# Patient Record
Sex: Female | Born: 1990 | Race: Black or African American | Hispanic: No | Marital: Single | Smoking: Never smoker
Health system: Southern US, Community
[De-identification: ages and names within clinical notes are randomized; demographics above are authoritative.]

## PROBLEM LIST (undated history)

## (undated) DIAGNOSIS — F259 Schizoaffective disorder, unspecified: Secondary | ICD-10-CM

## (undated) DIAGNOSIS — D571 Sickle-cell disease without crisis: Secondary | ICD-10-CM

## (undated) DIAGNOSIS — R479 Unspecified speech disturbances: Secondary | ICD-10-CM

## (undated) DIAGNOSIS — F809 Developmental disorder of speech and language, unspecified: Secondary | ICD-10-CM

## (undated) DIAGNOSIS — J45909 Unspecified asthma, uncomplicated: Secondary | ICD-10-CM

## (undated) DIAGNOSIS — F79 Unspecified intellectual disabilities: Secondary | ICD-10-CM

## (undated) DIAGNOSIS — F419 Anxiety disorder, unspecified: Secondary | ICD-10-CM

---

## 2014-11-20 ENCOUNTER — Emergency Department: Payer: Self-pay | Admitting: Emergency Medicine

## 2014-11-20 LAB — URINALYSIS, COMPLETE
BACTERIA: NONE SEEN
BILIRUBIN, UR: NEGATIVE
Blood: NEGATIVE
Glucose,UR: NEGATIVE mg/dL (ref 0–75)
KETONE: NEGATIVE
Leukocyte Esterase: NEGATIVE
Nitrite: NEGATIVE
Ph: 7 (ref 4.5–8.0)
Protein: NEGATIVE
RBC,UR: <1 /HPF (ref 0–5)
Specific Gravity: 1.001 (ref 1.003–1.030)
Squamous Epithelial: <1
WBC UR: NONE SEEN /HPF (ref 0–5)

## 2014-11-20 LAB — CBC
HCT: 34.1 % — ABNORMAL LOW (ref 35.0–47.0)
HGB: 10.5 g/dL — AB (ref 12.0–16.0)
MCH: 23.8 pg — ABNORMAL LOW (ref 26.0–34.0)
MCHC: 30.9 g/dL — AB (ref 32.0–36.0)
MCV: 77 fL — ABNORMAL LOW (ref 80–100)
Platelet: 132 10*3/uL — ABNORMAL LOW (ref 150–440)
RBC: 4.43 10*6/uL (ref 3.80–5.20)
RDW: 18.8 % — ABNORMAL HIGH (ref 11.5–14.5)
WBC: 7.8 10*3/uL (ref 3.6–11.0)

## 2014-11-20 LAB — DRUG SCREEN, URINE

## 2014-11-20 LAB — PREGNANCY, URINE: PREGNANCY TEST, URINE: NEGATIVE m[IU]/mL

## 2014-11-20 LAB — COMPREHENSIVE METABOLIC PANEL
ANION GAP: 6 — AB (ref 7–16)
Albumin: 3.9 g/dL (ref 3.4–5.0)
Alkaline Phosphatase: 71 U/L (ref 46–116)
BUN: 4 mg/dL — ABNORMAL LOW (ref 7–18)
Bilirubin,Total: 0.5 mg/dL (ref 0.2–1.0)
CHLORIDE: 111 mmol/L — AB (ref 98–107)
CREATININE: 0.75 mg/dL (ref 0.60–1.30)
Calcium, Total: 9.4 mg/dL (ref 8.5–10.1)
Co2: 24 mmol/L (ref 21–32)
EGFR (Non-African Amer.): >60
Glucose: 90 mg/dL (ref 65–99)
Osmolality: 278 (ref 275–301)
Potassium: 3.8 mmol/L (ref 3.5–5.1)
SGOT(AST): 32 U/L (ref 15–37)
SGPT (ALT): 39 U/L (ref 14–63)
Sodium: 141 mmol/L (ref 136–145)
TOTAL PROTEIN: 8 g/dL (ref 6.4–8.2)

## 2014-11-20 LAB — ETHANOL: Ethanol: <3 mg/dL

## 2014-11-20 LAB — VALPROIC ACID LEVEL: Valproic Acid: 69 ug/mL

## 2014-11-20 LAB — LITHIUM LEVEL: Lithium: 0.7 mmol/L

## 2014-11-20 LAB — ACETAMINOPHEN LEVEL: Acetaminophen: <2 ug/mL

## 2014-11-20 LAB — SALICYLATE LEVEL: Salicylates, Serum: <1.7 mg/dL

## 2014-12-15 ENCOUNTER — Emergency Department: Payer: Self-pay | Admitting: Emergency Medicine

## 2015-01-26 ENCOUNTER — Encounter (HOSPITAL_COMMUNITY): Payer: Self-pay | Admitting: *Deleted

## 2015-01-26 ENCOUNTER — Emergency Department (HOSPITAL_COMMUNITY)
Admission: EM | Admit: 2015-01-26 | Discharge: 2015-01-26 | Disposition: A | Discharge status: Home / Self Care | Payer: Medicaid Other | Attending: Emergency Medicine | Admitting: Emergency Medicine

## 2015-01-26 DIAGNOSIS — F319 Bipolar disorder, unspecified: Secondary | ICD-10-CM | POA: Diagnosis present

## 2015-01-26 DIAGNOSIS — Z862 Personal history of diseases of the blood and blood-forming organs and certain disorders involving the immune mechanism: Secondary | ICD-10-CM | POA: Diagnosis not present

## 2015-01-26 DIAGNOSIS — Z79899 Other long term (current) drug therapy: Secondary | ICD-10-CM | POA: Insufficient documentation

## 2015-01-26 DIAGNOSIS — J45909 Unspecified asthma, uncomplicated: Secondary | ICD-10-CM | POA: Insufficient documentation

## 2015-01-26 DIAGNOSIS — F419 Anxiety disorder, unspecified: Secondary | ICD-10-CM | POA: Diagnosis not present

## 2015-01-26 DIAGNOSIS — E669 Obesity, unspecified: Secondary | ICD-10-CM | POA: Diagnosis not present

## 2015-01-26 DIAGNOSIS — F259 Schizoaffective disorder, unspecified: Secondary | ICD-10-CM | POA: Diagnosis not present

## 2015-01-26 DIAGNOSIS — Z760 Encounter for issue of repeat prescription: Secondary | ICD-10-CM | POA: Diagnosis not present

## 2015-01-26 DIAGNOSIS — Z791 Long term (current) use of non-steroidal anti-inflammatories (NSAID): Secondary | ICD-10-CM | POA: Insufficient documentation

## 2015-01-26 HISTORY — DX: Sickle-cell disease without crisis: D57.1

## 2015-01-26 HISTORY — DX: Developmental disorder of speech and language, unspecified: F80.9

## 2015-01-26 HISTORY — DX: Unspecified intellectual disabilities: F79

## 2015-01-26 HISTORY — DX: Unspecified asthma, uncomplicated: J45.909

## 2015-01-26 HISTORY — DX: Unspecified speech disturbances: R47.9

## 2015-01-26 HISTORY — DX: Anxiety disorder, unspecified: F41.9

## 2015-01-26 HISTORY — DX: Schizoaffective disorder, unspecified: F25.9

## 2015-01-26 LAB — COMPREHENSIVE METABOLIC PANEL
ALBUMIN: 3.8 g/dL (ref 3.5–5.2)
ALK PHOS: 71 U/L (ref 39–117)
ALT: 30 U/L (ref 0–35)
AST: 28 U/L (ref 0–37)
Anion gap: 12 (ref 5–15)
BILIRUBIN TOTAL: 0.4 mg/dL (ref 0.3–1.2)
BUN: 7 mg/dL (ref 6–23)
CHLORIDE: 106 mmol/L (ref 96–112)
CO2: 24 mmol/L (ref 19–32)
CREATININE: 0.69 mg/dL (ref 0.50–1.10)
Calcium: 8.8 mg/dL (ref 8.4–10.5)
GFR calc Af Amer: >90 mL/min (ref >90)
Glucose, Bld: 85 mg/dL (ref 70–99)
Potassium: 3.8 mmol/L (ref 3.5–5.1)
SODIUM: 142 mmol/L (ref 135–145)
Total Protein: 7.6 g/dL (ref 6.0–8.3)

## 2015-01-26 LAB — CBC
HCT: 32.4 % — ABNORMAL LOW (ref 36.0–46.0)
HEMOGLOBIN: 10.8 g/dL — AB (ref 12.0–15.0)
MCH: 24.7 pg — AB (ref 26.0–34.0)
MCHC: 33.3 g/dL (ref 30.0–36.0)
MCV: 74 fL — AB (ref 78.0–100.0)
PLATELETS: 182 10*3/uL (ref 150–400)
RBC: 4.38 MIL/uL (ref 3.87–5.11)
RDW: 16.6 % — ABNORMAL HIGH (ref 11.5–15.5)
WBC: 10.6 10*3/uL — ABNORMAL HIGH (ref 4.0–10.5)

## 2015-01-26 LAB — VALPROIC ACID LEVEL: Valproic Acid Lvl: <10 ug/mL — ABNORMAL LOW (ref 50.0–100.0)

## 2015-01-26 LAB — LITHIUM LEVEL: Lithium Lvl: 0.24 mmol/L — ABNORMAL LOW (ref 0.80–1.40)

## 2015-01-26 MED ORDER — DIVALPROEX SODIUM ER 500 MG PO TB24
1000.0000 mg | ORAL_TABLET | Freq: Every day | ORAL | Status: DC
  Filled 2015-01-26: qty 2

## 2015-01-26 MED ORDER — LITHIUM CARBONATE ER 450 MG PO TBCR: 450.0000 mg | EXTENDED_RELEASE_TABLET | Freq: Every day | ORAL | Status: DC

## 2015-01-26 MED ORDER — LITHIUM CARBONATE ER 300 MG PO TBCR
300.0000 mg | EXTENDED_RELEASE_TABLET | Freq: Every day | ORAL | Status: DC
  Filled 2015-01-26: qty 1

## 2015-01-26 MED ORDER — LITHIUM CARBONATE ER 300 MG PO TBCR: 300.0000 mg | EXTENDED_RELEASE_TABLET | Freq: Every day | ORAL | Status: DC

## 2015-01-26 MED ORDER — LITHIUM CARBONATE ER 450 MG PO TBCR
450.0000 mg | EXTENDED_RELEASE_TABLET | Freq: Every day | ORAL | Status: DC
  Filled 2015-01-26: qty 1

## 2015-01-26 MED ORDER — QUETIAPINE FUMARATE 25 MG PO TABS
25.0000 mg | ORAL_TABLET | Freq: Two times a day (BID) | ORAL | Status: DC
  Administered 2015-01-26: 25 mg via ORAL

## 2015-01-26 MED ORDER — BUSPIRONE HCL 10 MG PO TABS
10.0000 mg | ORAL_TABLET | Freq: Three times a day (TID) | ORAL | Status: DC
  Administered 2015-01-26: 10 mg via ORAL
  Filled 2015-01-26: qty 1

## 2015-01-26 MED ORDER — QUETIAPINE FUMARATE 100 MG PO TABS: 100.0000 mg | ORAL_TABLET | Freq: Every day | ORAL | Status: DC

## 2015-01-26 MED ORDER — BUSPIRONE HCL 10 MG PO TABS: 10.0000 mg | ORAL_TABLET | Freq: Three times a day (TID) | ORAL | Status: DC

## 2015-01-26 MED ORDER — DIVALPROEX SODIUM ER 500 MG PO TB24: 1000.0000 mg | ORAL_TABLET | Freq: Every day | ORAL | Status: DC

## 2015-01-26 MED ORDER — QUETIAPINE FUMARATE 100 MG PO TABS
100.0000 mg | ORAL_TABLET | Freq: Every day | ORAL | Status: DC
  Filled 2015-01-26: qty 1

## 2015-01-26 MED ORDER — QUETIAPINE FUMARATE 25 MG PO TABS: 25.0000 mg | ORAL_TABLET | Freq: Two times a day (BID) | ORAL | Status: DC

## 2015-01-26 NOTE — ED Notes (Signed)
Responsible caregiver had to leave. Caregiver left a number they can be reached at [phone], and [phone]

## 2015-01-26 NOTE — Discharge Instructions (Signed)
It was our pleasure to provide your ER care today - we hope that you feel better.  Follow up with primary care doctor/therapist early this week to establish close follow up with them, as well as to discuss medications and future refills of medication.  For mental health crisis, go directly to [facility].  Return to ER if worse, new symptoms, fevers, medical emergency, other concern.     Schizoaffective Disorder Schizoaffective disorder (ScAD) is a mental illness. It causes symptoms that are a mixture of schizophrenia (a psychotic disorder) and an affective (mood) disorder. The schizophrenic symptoms may include delusions, hallucinations, or odd behavior. The mood symptoms may be similar to major depression or bipolar disorder. ScAD may interfere with personal relationships or normal daily activities. People with ScAD are at increased risk for job loss, social isolation,physical health problems, anxiety and substance use disorders, and suicide. ScAD usually occurs in cycles. Periods of severe symptoms are followed by periods of less severe symptoms or improvement. The illness affects men and women equally but usually appears at an earlier age (teenage or early adult years) in men. People who have family members with schizophrenia, bipolar disorder, or ScAD are at higher risk of developing ScAD. SYMPTOMS  At any one time, people with ScAD may have psychotic symptoms only or both psychotic and mood symptoms. The psychotic symptoms include one or more of the following:  Hearing, seeing, or feeling things that are not there (hallucinations).   Having fixed, false beliefs (delusions). The delusions usually are of being attacked, harassed, cheated, persecuted, or conspired against (paranoid delusions).  Speaking in a way that makes no sense to others (disorganized speech). The psychotic symptoms of ScAD may also include confusing or odd behavior or any of the negative symptoms of schizophrenia. These  include loss of motivation for normal daily activities, such as bathing or grooming, withdrawal from other people, and lack of emotions.  The mood symptoms of ScAD occur more often than not. They resemble major depressive disorder or bipolar mania. Symptoms of major depression include depressed mood and four or more of the following:  Loss of interest in usually pleasurable activities (anhedonia).  Sleeping more or less than normal.  Feeling worthless or excessively guilty.  Lack of energy or motivation.  Trouble concentrating.  Eating more or less than usual.  Thinking a lot about death or suicide. Symptoms of bipolar mania include abnormally elevated or irritable mood and increased energy or activity, plus three or more of the following:   More confidence than normal or feeling that you are able to do anything (grandiosity).  Feeling rested with less sleep than normal.   Being easily distracted.   Talking more than usual or feeling pressured to keep talking.   Feeling that your thoughts are racing.  Engaging in high-risk activities such as buying sprees or foolish business decisions. DIAGNOSIS  ScAD is diagnosed through an assessment by your health care provider. Your health care provider will observe and ask questions about your thoughts, behavior, mood, and ability to function in daily life. Your health care provider may also ask questions about your medical history and use of drugs, including prescription medicines. Your health care provider may also order blood tests and imaging exams. Certain medical conditions and substances can cause symptoms that resemble ScAD. Your health care provider may refer you to a mental health specialist for evaluation.  ScAD is divided into two types. The depressive type is diagnosed if your mood symptoms are limited to major  depression. The bipolar type is diagnosed if your mood symptoms are manic or a mixture of manic and depressive  symptoms TREATMENT  ScAD is usually a lifelong illness. Long-term treatment is necessary. The following treatments are available:  Medicine. Different types of medicine are used to treat ScAD. The exact combination depends on the type and severity of your symptoms. Antipsychotic medicine is used to control psychotic symptomssuch as delusions, paranoia, and hallucinations. Mood stabilizers can even the highs and lows of bipolar manic mood swings. Antidepressant medicines are used to treat major depressive symptoms.  Counseling or talk therapy. Individual, group, or family counseling may be helpful in providing education, support, and guidance. Many people with ScAD also benefit from social skills and job skills (vocational) training. A combination of medicine and counseling is usually best for managing the disorder over time. A procedure in which electricity is applied to the brain through the scalp (electroconvulsive therapy) may be used to treat people with severe manic symptoms that do not respond to medicine and counseling. HOME CARE INSTRUCTIONS   Take all your medicine as prescribed.  Check with your health care provider before starting new prescription or over-the-counter medicines.  Keep all follow up appointments with your health care provider. SEEK MEDICAL CARE IF:   If you are not able to take your medicines as prescribed.  If your symptoms get worse. SEEK IMMEDIATE MEDICAL CARE IF:   You have serious thoughts about hurting yourself or others. Document Released: 02/15/2007 Document Revised: 02/19/2014 Document Reviewed: 05/19/2013 [facility] Patient Information 2015 [place]. This information is not intended to replace advice given to you by your health care provider. Make sure you discuss any questions you have with your health care provider.

## 2015-01-26 NOTE — ED Notes (Addendum)
[facility] [place], pt has run out of meds and can not get a doctor in [place] to see [place]. She was brought to [facility] this morning. They referred her here for meds due to no MD able to see her today. Meds listed under medications need to be refilled. [facility] in [place] is location pt gets her meds. They have requested medical clearance and to stay here until Monday and follow up with [facility] then. Pt nonverbal during triage and states she feels "ok" During triage pt went on to state she has a lot of pain in her back

## 2015-01-26 NOTE — ED Notes (Signed)
Orange juice given to the patient.

## 2015-01-26 NOTE — Progress Notes (Signed)
3:50pm. CSW received consult from MD to facilitate discharge of pt. Pt was brought in by [facility]. Staff states that she has not been taking her medicines, and so they took her to [facility]. Per staff, Beverly Sessions stated they could not refill her medicine until Monday and they recommended they bring her here to ED until then. Pt is calm, appropriate, and denying SI, HI, AVH. Pt is appropriate with staff and able to ask coherently for her needs (e.g. Can i have a soda to drink, etc.). Thus, pt is appropriate for discharge.  CSW attempted to contact her responsible caretaker at both [phone], and [phone]. Both numbers went to voicemail, and CSW left messages. CSW called general group home number ([phone]), and spoke with owner, Jacklynn Barnacle, daughter. Daughter would not provide name. Daughter states they will accept pt back and while CSW was on phone, she called designated caretaker to come back and pick up pt. Psych NP offered to write appropriate scripts to hold pt over until [facility] appointment.   Per owner's daughter, someone is en route to pick up pt. CSW met with pt and made her aware of discharge plans. Pt states she agrees with plans.  Rochele Pages,     ED CSW  phone: [phone]

## 2015-01-26 NOTE — ED Provider Notes (Signed)
CSN: [id number]     Arrival date & time 01/26/15  1322 History   First MD Initiated Contact with Patient 01/26/15 1454     Chief Complaint  Patient presents with  . Medical Clearance  . Medication Refill     (Consider location/radiation/quality/duration/timing/severity/associated sxs/prior Treatment) The history is provided by the patient. The history is limited by the condition of the patient.  pt w hx schizoaffective disorder, from group home w report that pt has been out of her medications 'for quite awhile'.  Pt unsure of how long she has been without medications.  Pt is very limited historian - level 5 caveat. pt denies any acute symptoms or complaints. Denies depression or thoughts of harm to self or others. Is pleasant, awake, and responds briefly to questions. No fever or chills. No pain. Normal appetite. States feels fine.      Past Medical History  Diagnosis Date  . Anxiety   . Sickle cell anemia   . Schizoaffective disorder   . Mental retardation   . Asthma   . Speech and language deficits    History reviewed. No pertinent past surgical history. No family history on file. History  Substance Use Topics  . Smoking status: Never Smoker   . Smokeless tobacco: Not on file  . Alcohol Use: No   OB History    No data available     Review of Systems  Constitutional: Negative for fever and chills.  Eyes: Negative for redness.  Respiratory: Negative for shortness of breath.   Cardiovascular: Negative for chest pain.  Gastrointestinal: Negative for abdominal pain.  Genitourinary: Negative for flank pain.  Musculoskeletal: Negative for back pain and neck pain.  Skin: Negative for rash.  Neurological: Negative for headaches.  Hematological: Does not bruise/bleed easily.  Psychiatric/Behavioral: Negative for suicidal ideas and dysphoric mood.      Allergies  Review of patient's allergies indicates no known allergies.  Home Medications   Prior to Admission  medications   Medication Sig Start Date End Date Taking? Authorizing Provider  busPIRone (BUSPAR) 10 MG tablet Take 20 mg by mouth 2 (two) times daily.   Yes Historical Provider, MD  divalproex (DEPAKOTE ER) 500 MG 24 hr tablet Take 1,000 mg by mouth at bedtime.   Yes Historical Provider, MD  haloperidol (HALDOL) 5 MG tablet Take 5 mg by mouth every morning.   Yes Historical Provider, MD  lithium carbonate (ESKALITH) 450 MG CR tablet Take 450 mg by mouth at bedtime.   Yes Historical Provider, MD  lithium carbonate 300 MG capsule Take 300 mg by mouth.   Yes Historical Provider, MD  medroxyPROGESTERone (DEPO-PROVERA) 150 MG/ML injection Inject 150 mg into the muscle every 3 (three) months.   Yes Historical Provider, MD  naproxen (NAPROSYN) 500 MG tablet Take 500 mg by mouth 2 (two) times daily as needed for mild pain.   Yes Historical Provider, MD  promethazine (PHENERGAN) 25 MG tablet Take 25 mg by mouth every 6 (six) hours as needed for nausea or vomiting.   Yes Historical Provider, MD  QUEtiapine (SEROQUEL) 300 MG tablet Take 600 mg by mouth at bedtime.   Yes Historical Provider, MD  QUEtiapine (SEROQUEL) 50 MG tablet Take 50 mg by mouth 2 (two) times daily.   Yes Historical Provider, MD   BP 123/80 mmHg  Pulse 104  Temp(Src) 98.7 F (37.1 C) (Oral)  Resp 16  SpO2 100% Physical Exam  Constitutional: She appears well-developed and well-nourished. No distress.  HENT:  Head: Atraumatic.  Mouth/Throat: Oropharynx is clear and moist.  Eyes: Conjunctivae are normal. Pupils are equal, round, and reactive to light. No scleral icterus.  Neck: Neck supple. No tracheal deviation present.  Cardiovascular: Normal rate, regular rhythm, normal heart sounds and intact distal pulses.   Pulmonary/Chest: Effort normal and breath sounds normal. No respiratory distress.  Abdominal: Soft. Normal appearance. She exhibits no distension. There is no tenderness.  obese  Musculoskeletal: She exhibits no edema.   Neurological: She is alert.  Awake/alert. Content. Ambulates w steady gait.   Skin: Skin is warm and dry. No rash noted.  Psychiatric: She has a normal mood and affect.  Denies SI.   Nursing note and vitals reviewed.   ED Course  Procedures (including critical care time) Labs Review   Results for orders placed or performed during the hospital encounter of 01/26/15  Comprehensive metabolic panel  Result Value Ref Range   Sodium 142 135 - 145 mmol/L   Potassium 3.8 3.5 - 5.1 mmol/L   Chloride 106 96 - 112 mmol/L   CO2 24 19 - 32 mmol/L   Glucose, Bld 85 70 - 99 mg/dL   BUN 7 6 - 23 mg/dL   Creatinine, Ser 1.61 0.50 - 1.10 mg/dL   Calcium 8.8 8.4 - 09.6 mg/dL   Total Protein 7.6 6.0 - 8.3 g/dL   Albumin 3.8 3.5 - 5.2 g/dL   AST 28 0 - 37 U/L   ALT 30 0 - 35 U/L   Alkaline Phosphatase 71 39 - 117 U/L   Total Bilirubin 0.4 0.3 - 1.2 mg/dL   GFR calc non Af Amer >90 >90 mL/min   GFR calc Af Amer >90 >90 mL/min   Anion gap 12 5 - 15  CBC  Result Value Ref Range   WBC 10.6 (H) 4.0 - 10.5 K/uL   RBC 4.38 3.87 - 5.11 MIL/uL   Hemoglobin 10.8 (L) 12.0 - 15.0 g/dL   HCT 04.5 (L) 40.9 - 81.1 %   MCV 74.0 (L) 78.0 - 100.0 fL   MCH 24.7 (L) 26.0 - 34.0 pg   MCHC 33.3 30.0 - 36.0 g/dL   RDW 91.4 (H) 78.2 - 95.6 %   Platelets 182 150 - 400 K/uL  Lithium level  Result Value Ref Range   Lithium Lvl 0.24 (L) 0.80 - 1.40 mmol/L      MDM   Labs.  Case management called RE: facilitation transportation back to group home.  Reviewed nursing notes and prior charts for additional history.   Case manager/SW called - they have arrange for pts group home to pick up and return pt to group home - [facility] team has written rx for pts psych meds.    Close pcp/therapist follow up, including to arrange future meds/refill of meds.  Pt currently asymptomatic, hr 88, rr 16, afeb, bp normal, and appears stable for d/c.      Cathren Laine, MD 01/26/15 [phone]

## 2015-01-26 NOTE — ED Notes (Signed)
SW at bedside.

## 2015-02-02 ENCOUNTER — Emergency Department
Admit: 2015-02-02 | Disposition: A | Discharge status: Home / Self Care | Payer: Self-pay | Admitting: Emergency Medicine

## 2015-02-02 LAB — COMPREHENSIVE METABOLIC PANEL
ANION GAP: 3 — AB (ref 7–16)
AST: 18 U/L
Albumin: 3.9 g/dL
Alkaline Phosphatase: 74 U/L
BUN: 6 mg/dL
Bilirubin,Total: 0.7 mg/dL
CALCIUM: 9 mg/dL
Chloride: 112 mmol/L — ABNORMAL HIGH
Co2: 26 mmol/L
Creatinine: 0.74 mg/dL
Glucose: 94 mg/dL
Potassium: 3.9 mmol/L
SGPT (ALT): 17 U/L
SODIUM: 141 mmol/L
TOTAL PROTEIN: 7.7 g/dL

## 2015-02-02 LAB — DRUG SCREEN, URINE
AMPHETAMINES, UR SCREEN: NEGATIVE
Barbiturates, Ur Screen: NEGATIVE
Benzodiazepine, Ur Scrn: NEGATIVE
Cannabinoid 50 Ng, Ur ~~LOC~~: NEGATIVE
Cocaine Metabolite,Ur ~~LOC~~: NEGATIVE
MDMA (Ecstasy)Ur Screen: NEGATIVE
Methadone, Ur Screen: NEGATIVE
Opiate, Ur Screen: NEGATIVE
Phencyclidine (PCP) Ur S: NEGATIVE
TRICYCLIC, UR SCREEN: NEGATIVE

## 2015-02-02 LAB — URINALYSIS, COMPLETE
Bilirubin,UR: NEGATIVE
Blood: NEGATIVE
Glucose,UR: NEGATIVE mg/dL (ref 0–75)
Ketone: NEGATIVE
Leukocyte Esterase: NEGATIVE
NITRITE: NEGATIVE
Ph: 7 (ref 4.5–8.0)
Protein: NEGATIVE
RBC,UR: NONE SEEN /HPF (ref 0–5)
Specific Gravity: 1.004 (ref 1.003–1.030)

## 2015-02-02 LAB — CBC
HCT: 34 % — ABNORMAL LOW (ref 35.0–47.0)
HGB: 10.8 g/dL — ABNORMAL LOW (ref 12.0–16.0)
MCH: 23.8 pg — ABNORMAL LOW (ref 26.0–34.0)
MCHC: 31.6 g/dL — ABNORMAL LOW (ref 32.0–36.0)
MCV: 76 fL — AB (ref 80–100)
PLATELETS: 163 10*3/uL (ref 150–440)
RBC: 4.51 10*6/uL (ref 3.80–5.20)
RDW: 17.9 % — ABNORMAL HIGH (ref 11.5–14.5)
WBC: 8.3 10*3/uL (ref 3.6–11.0)

## 2015-02-02 LAB — ACETAMINOPHEN LEVEL: Acetaminophen: <10 ug/mL

## 2015-02-02 LAB — ETHANOL: Ethanol: <5 mg/dL

## 2015-02-02 LAB — SALICYLATE LEVEL

## 2015-02-05 ENCOUNTER — Emergency Department
Admit: 2015-02-05 | Disposition: A | Discharge status: Home / Self Care | Payer: Self-pay | Admitting: Emergency Medicine

## 2015-02-05 LAB — BASIC METABOLIC PANEL
Anion Gap: 4 — ABNORMAL LOW (ref 7–16)
BUN: 8 mg/dL
CALCIUM: 9 mg/dL
CHLORIDE: 109 mmol/L
Co2: 26 mmol/L
Creatinine: 0.6 mg/dL
EGFR (African American): >60
GLUCOSE: 104 mg/dL — AB
Potassium: 3.8 mmol/L
Sodium: 139 mmol/L

## 2015-02-05 LAB — CBC
HCT: 33.4 % — ABNORMAL LOW (ref 35.0–47.0)
HGB: 10.6 g/dL — ABNORMAL LOW (ref 12.0–16.0)
MCH: 24.1 pg — AB (ref 26.0–34.0)
MCHC: 31.8 g/dL — ABNORMAL LOW (ref 32.0–36.0)
MCV: 76 fL — AB (ref 80–100)
PLATELETS: 144 10*3/uL — AB (ref 150–440)
RBC: 4.41 10*6/uL (ref 3.80–5.20)
RDW: 17.6 % — AB (ref 11.5–14.5)
WBC: 7.8 10*3/uL (ref 3.6–11.0)

## 2015-02-05 LAB — TROPONIN I: Troponin-I: <0.03 ng/mL
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
## 2015-03-13 ENCOUNTER — Encounter: Payer: Self-pay | Admitting: General Practice

## 2015-03-13 ENCOUNTER — Emergency Department
Admission: EM | Admit: 2015-03-13 | Discharge: 2015-03-14 | Disposition: A | Discharge status: Home / Self Care | Payer: MEDICAID | Attending: Student | Admitting: Student

## 2015-03-13 DIAGNOSIS — F70 Mild intellectual disabilities: Secondary | ICD-10-CM | POA: Diagnosis not present

## 2015-03-13 DIAGNOSIS — R45851 Suicidal ideations: Secondary | ICD-10-CM | POA: Diagnosis present

## 2015-03-13 DIAGNOSIS — Z79899 Other long term (current) drug therapy: Secondary | ICD-10-CM | POA: Diagnosis not present

## 2015-03-13 DIAGNOSIS — F209 Schizophrenia, unspecified: Secondary | ICD-10-CM | POA: Diagnosis not present

## 2015-03-13 DIAGNOSIS — J45909 Unspecified asthma, uncomplicated: Secondary | ICD-10-CM

## 2015-03-13 DIAGNOSIS — R32 Unspecified urinary incontinence: Secondary | ICD-10-CM | POA: Insufficient documentation

## 2015-03-13 DIAGNOSIS — Z3202 Encounter for pregnancy test, result negative: Secondary | ICD-10-CM | POA: Diagnosis not present

## 2015-03-13 LAB — COMPREHENSIVE METABOLIC PANEL
ALBUMIN: 4.1 g/dL (ref 3.5–5.0)
ALK PHOS: 86 U/L (ref 38–126)
ALT: 21 U/L (ref 14–54)
ANION GAP: 7 (ref 5–15)
AST: 27 U/L (ref 15–41)
BUN: 7 mg/dL (ref 6–20)
CALCIUM: 9.2 mg/dL (ref 8.9–10.3)
CO2: 24 mmol/L (ref 22–32)
Chloride: 107 mmol/L (ref 101–111)
Creatinine, Ser: 0.76 mg/dL (ref 0.44–1.00)
GFR calc Af Amer: >60 mL/min (ref >60)
GFR calc non Af Amer: >60 mL/min (ref >60)
Glucose, Bld: 80 mg/dL (ref 65–99)
Potassium: 3.7 mmol/L (ref 3.5–5.1)
SODIUM: 138 mmol/L (ref 135–145)
Total Bilirubin: 0.7 mg/dL (ref 0.3–1.2)
Total Protein: 8.4 g/dL — ABNORMAL HIGH (ref 6.5–8.1)

## 2015-03-13 LAB — URINALYSIS COMPLETE WITH MICROSCOPIC (ARMC ONLY)
Bilirubin Urine: NEGATIVE
GLUCOSE, UA: NEGATIVE mg/dL
Hgb urine dipstick: NEGATIVE
Ketones, ur: NEGATIVE mg/dL
LEUKOCYTES UA: NEGATIVE
NITRITE: NEGATIVE
PH: 6 (ref 5.0–8.0)
Protein, ur: NEGATIVE mg/dL
SPECIFIC GRAVITY, URINE: 1.003 — AB (ref 1.005–1.030)

## 2015-03-13 LAB — CBC
HCT: 33.3 % — ABNORMAL LOW (ref 35.0–47.0)
HEMOGLOBIN: 10.6 g/dL — AB (ref 12.0–16.0)
MCH: 24.2 pg — AB (ref 26.0–34.0)
MCHC: 32 g/dL (ref 32.0–36.0)
MCV: 75.5 fL — ABNORMAL LOW (ref 80.0–100.0)
Platelets: 161 10*3/uL (ref 150–440)
RBC: 4.41 MIL/uL (ref 3.80–5.20)
RDW: 18.1 % — ABNORMAL HIGH (ref 11.5–14.5)
WBC: 10.7 10*3/uL (ref 3.6–11.0)

## 2015-03-13 LAB — URINE DRUG SCREEN, QUALITATIVE (ARMC ONLY)
Amphetamines, Ur Screen: NOT DETECTED
BARBITURATES, UR SCREEN: NOT DETECTED
Benzodiazepine, Ur Scrn: NOT DETECTED
COCAINE METABOLITE, UR ~~LOC~~: NOT DETECTED
Cannabinoid 50 Ng, Ur ~~LOC~~: NOT DETECTED
MDMA (Ecstasy)Ur Screen: NOT DETECTED
METHADONE SCREEN, URINE: NOT DETECTED
Opiate, Ur Screen: NOT DETECTED
PHENCYCLIDINE (PCP) UR S: NOT DETECTED
Tricyclic, Ur Screen: POSITIVE — AB

## 2015-03-13 LAB — PREGNANCY, URINE: PREG TEST UR: NEGATIVE

## 2015-03-13 LAB — ACETAMINOPHEN LEVEL: Acetaminophen (Tylenol), Serum: <10 ug/mL — ABNORMAL LOW (ref 10–30)

## 2015-03-13 LAB — ETHANOL: Alcohol, Ethyl (B): <5 mg/dL (ref <5)

## 2015-03-13 LAB — SALICYLATE LEVEL: Salicylate Lvl: <4 mg/dL (ref 2.8–30.0)

## 2015-03-13 MED ORDER — QUETIAPINE FUMARATE 300 MG PO TABS
300.0000 mg | ORAL_TABLET | Freq: Every day | ORAL | Status: DC
  Administered 2015-03-13: 300 mg via ORAL
  Filled 2015-03-13 (×2): qty 1

## 2015-03-13 MED ORDER — BUSPIRONE HCL 10 MG PO TABS
10.0000 mg | ORAL_TABLET | Freq: Three times a day (TID) | ORAL | Status: DC
  Administered 2015-03-13 – 2015-03-14 (×3): 10 mg via ORAL
  Filled 2015-03-13 (×5): qty 1

## 2015-03-13 MED ORDER — LITHIUM CARBONATE ER 300 MG PO TBCR
EXTENDED_RELEASE_TABLET | ORAL | Status: AC
  Administered 2015-03-13: 450 mg via ORAL
  Filled 2015-03-13: qty 1

## 2015-03-13 MED ORDER — DIVALPROEX SODIUM 500 MG PO DR TAB
500.0000 mg | DELAYED_RELEASE_TABLET | Freq: Two times a day (BID) | ORAL | Status: DC
  Administered 2015-03-13 – 2015-03-14 (×2): 500 mg via ORAL
  Filled 2015-03-13 (×3): qty 1

## 2015-03-13 MED ORDER — LITHIUM CARBONATE ER 450 MG PO TBCR
450.0000 mg | EXTENDED_RELEASE_TABLET | Freq: Two times a day (BID) | ORAL | Status: DC
  Administered 2015-03-13 – 2015-03-14 (×2): 450 mg via ORAL
  Filled 2015-03-13 (×3): qty 1

## 2015-03-13 MED ORDER — HALOPERIDOL 5 MG PO TABS
5.0000 mg | ORAL_TABLET | Freq: Two times a day (BID) | ORAL | Status: DC
  Administered 2015-03-13 – 2015-03-14 (×2): 5 mg via ORAL
  Filled 2015-03-13 (×2): qty 1

## 2015-03-13 MED ORDER — LITHIUM CARBONATE ER 300 MG PO TBCR
EXTENDED_RELEASE_TABLET | ORAL | Status: AC
  Filled 2015-03-13: qty 1

## 2015-03-13 NOTE — ED Notes (Signed)
Transfer to [facility]  

## 2015-03-13 NOTE — ED Notes (Signed)
Report called and given to Chadds FordStephanie, Charity fundraiserN. [facility]. [facility]ical engineerofficer and nurse.

## 2015-03-13 NOTE — ED Notes (Signed)
Pt. Given supper Tray. In hall bed at this time. No acute distress noted.
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
## 2015-03-13 NOTE — ED Notes (Signed)
Nurse contacted pt's guardian, Trula OreChristina, who verbalized that she was unable to pick pt up for discharge. Christina requested to contact group home for transportation and states "tell my sister i will come get her as soon as i can".  Pt notified and verbalized understanding at this time.
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
## 2015-03-13 NOTE — ED Notes (Signed)
Pt tranferred from ED to [facility].the patient is calm and cooperative at present.
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
## 2015-03-13 NOTE — ED Notes (Signed)
MD at bedside. (Dr. Clapacs) 

## 2015-03-13 NOTE — ED Notes (Signed)
Pt sister Trula OreChristina here to visit patient. States patient cannot return to group home and she is going to call a new group home in [place] tomorrow AM. Explained visitation hours. Pt sister states she will call tomorrow after she speaks with new group home.

## 2015-03-13 NOTE — ED Notes (Signed)
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
## 2015-03-13 NOTE — ED Notes (Signed)
Pt  Vol  Papers  Rescinded  By  Dr  Toni Amendclapacs on 03/13/15  Pt  Moved  To  B[facility]  Group  Home  Will pick up In  The  Am

## 2015-03-13 NOTE — ED Notes (Signed)
Pt eating snack tray  

## 2015-03-13 NOTE — ED Notes (Addendum)
[facility], care assumed.  Pt laying in bed calm and cooperative at this time with no complaints, will continue to monitor.  Rounder doing q15 min rounds.

## 2015-03-13 NOTE — ED Notes (Signed)
meds given per md order  

## 2015-03-13 NOTE — ED Notes (Signed)

## 2015-03-13 NOTE — ED Notes (Signed)
Spoke with Dr Shaune PollackLord, pt able to eat. Pt provided with tray and water.
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
## 2015-03-13 NOTE — ED Notes (Signed)
BEHAVIORAL HEALTH ROUNDING Patient sleeping: No. Patient alert and oriented: yes Behavior appropriate: Yes.   Nutrition and fluids offered: Yes  Toileting and hygiene offered: Yes  Sitter present: 15 min checks  Law enforcement present: Yes  

## 2015-03-13 NOTE — ED Notes (Signed)
Patient assigned to appropriate care area. Patient oriented to unit/care area: Informed that, for their safety, care areas are designed for safety and monitored by security cameras at all times; and visiting hours explained to patient. Patient verbalizes understanding, and verbal contract for safety obtained. 

## 2015-03-13 NOTE — ED Notes (Signed)
BEHAVIORAL HEALTH ROUNDING Patient sleeping: No. Patient alert and oriented: yes Behavior appropriate: Yes.  ; If no, describe:  Nutrition and fluids offered: Yes  Toileting and hygiene offered: Yes  Sitter present: no Law enforcement present: Yes  

## 2015-03-13 NOTE — ED Notes (Signed)

## 2015-03-13 NOTE — ED Notes (Signed)
ED BHU PLACEMENT JUSTIFICATION Is the patient under IVC or is there intent for IVC: Yes.   Is the patient medically cleared: Yes.   Is there vacancy in the ED BHU: Yes.   Is the population mix appropriate for patient: Yes.   Is the patient awaiting placement in inpatient or outpatient setting: Yes.   Has the patient had a psychiatric consult: Yes.   Survey of unit performed for contraband, proper placement and condition of furniture, tampering with fixtures in bathroom, shower, and each patient room: Yes.  ; Findings:  APPEARANCE/BEHAVIOR calm, cooperative and adequate rapport can be established NEURO ASSESSMENT Orientation: person Hallucinations: No.None noted (Hallucinations) Speech: Normal Gait: normal RESPIRATORY ASSESSMENT Normal expansion.  Clear to auscultation.  No rales, rhonchi, or wheezing. CARDIOVASCULAR ASSESSMENT regular rate and rhythm, S1, S2 normal, no murmur, click, rub or gallop GASTROINTESTINAL ASSESSMENT soft, nontender, BS WNL, no r/g EXTREMITIES normal strength, tone, and muscle mass PLAN OF CARE Provide calm/safe environment. Vital signs assessed twice daily. ED BHU Assessment once each 12-hour shift. Collaborate with intake RN daily or as condition indicates. Assure the ED provider has rounded once each shift. Provide and encourage hygiene. Provide redirection as needed. Assess for escalating behavior; address immediately and inform ED provider.  Assess family dynamic and appropriateness for visitation as needed: Yes.  ; If necessary, describe findings:  Educate the patient/family about BHU procedures/visitation: Yes.  ; If necessary, describe findings:  

## 2015-03-13 NOTE — ED Notes (Signed)
BEHAVIORAL HEALTH ROUNDING Patient sleeping: Yes.   Patient alert and oriented: not applicable Behavior appropriate: Yes.   Nutrition and fluids offered: pt sleeping  Toileting and hygiene offered: pt sleeping  Sitter present: 15 min checks  Law enforcement present: Yes  
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
## 2015-03-13 NOTE — Discharge Instructions (Signed)
Schizophrenia °Schizophrenia is a mental illness. It may cause disturbed or disorganized thinking, speech, or behavior. People with schizophrenia have problems functioning in one or more areas of life: work, school, home, or relationships. People with schizophrenia are at increased risk for suicide, certain chronic physical illnesses, and unhealthy behaviors, such as smoking and drug use. °People who have family members with schizophrenia are at higher risk of developing the illness. Schizophrenia affects men and women equally but usually appears at an earlier age (teenage or early adult years) in men.  °SYMPTOMS °The earliest symptoms are often subtle (prodrome) and may go unnoticed until the illness becomes more severe (first-break psychosis). Symptoms of schizophrenia may be continuous or may come and go in severity. Episodes often are triggered by major life events, such as family stress, college, military service, marriage, pregnancy or child birth, divorce, or loss of a loved one. People with schizophrenia may see, hear, or feel things that do not exist (hallucinations). They may have false beliefs in spite of obvious proof to the contrary (delusions). Sometimes speech is incoherent or behavior is odd or withdrawn.  °DIAGNOSIS °Schizophrenia is diagnosed through an assessment by your caregiver. Your caregiver will ask questions about your thoughts, behavior, mood, and ability to function in daily life. Your caregiver may ask questions about your medical history and use of alcohol or drugs, including prescription medication. Your caregiver may also order blood tests and imaging exams. Certain medical conditions and substances can cause symptoms that resemble schizophrenia. Your caregiver may refer you to a mental health specialist for evaluation. There are three major criterion for a diagnosis of schizophrenia: °· Two or more of the following five symptoms are present for a month or longer: °¨ Delusions. Often  the delusions are that you are being attacked, harassed, cheated, persecuted or conspired against (persecutory delusions). °¨ Hallucinations.   °¨ Disorganized speech that does not make sense to others. °¨ Grossly disorganized (confused or unfocused) behavior or extremely overactive or underactive motor activity (catatonia). °¨ Negative symptoms such as bland or blunted emotions (flat affect), loss of will power (avolition), and withdrawal from social contacts (social isolation). °· Level of functioning in one or more major areas of life (work, school, relationships, or self-care) is markedly below the level of functioning before the onset of illness.   °· There are continuous signs of illness (either mild symptoms or decreased level of functioning) for at least 6 months or longer. °TREATMENT  °Schizophrenia is a long-term illness. It is best controlled with continuous treatment rather than treatment only when symptoms occur. The following treatments are used to manage schizophrenia: °· Medication--Medication is the most effective and important form of treatment for schizophrenia. Antipsychotic medications are usually prescribed to help manage schizophrenia. Other types of medication may be added to relieve any symptoms that may occur despite the use of antipsychotic medications. °· Counseling or talk therapy--Individual, group, or family counseling may be helpful in providing education, support, and guidance. Many people with schizophrenia also benefit from social skills and job skills (vocational) training. °A combination of medication and counseling is best for managing the disorder over time. A procedure in which electricity is applied to the brain through the scalp (electroconvulsive therapy) may be used to treat catatonic schizophrenia or schizophrenia in people who cannot take or do not respond to medication and counseling. °Document Released: 10/02/2000 Document Revised: 06/07/2013 Document Reviewed:  12/28/2012 °[employer]® Patient Information ©2015 [employer]. This information is not intended to replace advice given to you by   your health care provider. Make sure you discuss any questions you have with your health care provider. ° °

## 2015-03-14 NOTE — ED Notes (Signed)
BEHAVIORAL HEALTH ROUNDING Patient sleeping: Yes.   Patient alert and oriented: eyes closed  Appears asleep Behavior appropriate: Yes.  ; If no, describe:  Nutrition and fluids offered: Yes  Toileting and hygiene offered: sleeping Sitter present: q 15 minute observations and security camera monitoring Law enforcement present: yes  [facility] 

## 2015-03-14 NOTE — ED Notes (Signed)
Patient observed lying in bed with eyes closed  Even, unlabored respirations observed   NAD pt appears to be sleeping  I will continue to monitor along with every 15 minute visual observations and ongoing security camera monitoring    

## 2015-03-14 NOTE — ED Notes (Signed)

## 2015-03-14 NOTE — ED Notes (Signed)
Breakfast provided   Patient observed lying in bed with eyes closed  Even, unlabored respirations observed   NAD pt appears to be sleeping  I will continue to monitor along with every 15 minute visual observations and ongoing security camera monitoring    

## 2015-03-14 NOTE — ED Notes (Signed)

## 2015-03-14 NOTE — ED Notes (Signed)
Call made to group home owner Joni Reiningicole @ [phone] message left in regards to when someone would be picking her up asked to please give a return call.

## 2015-03-14 NOTE — ED Notes (Signed)
Pt laying in bed, no change in condition will continue to monitor.  

## 2015-03-14 NOTE — ED Notes (Signed)
Pt incontinent of urine, linens changed, pt given new change of clothes.

## 2015-03-14 NOTE — ED Notes (Signed)
BEHAVIORAL HEALTH ROUNDING Patient sleeping: No. Patient alert and oriented: yes Behavior appropriate: Yes.  ; If no, describe:  Nutrition and fluids offered: yes Toileting and hygiene offered: Yes  Sitter present: q15 minute observations and security camera monitoring Law enforcement present: Yes  ODS  

## 2015-03-14 NOTE — ED Notes (Signed)
Pt laying in bed no distress noted  

## 2015-03-14 NOTE — ED Notes (Signed)
Patient observed with no unusual behavior or acute distress. Patient with no verbalized needs or c/o at this time.... will continue to monitor and follow up as needed. Security staff monitoring patient on [employer] system.  

## 2015-03-14 NOTE — ED Notes (Signed)
1/1 bags of belongings returned to the pt   Discharge instructions reviewed with her and group home staff     Pt denies pain

## 2015-03-14 NOTE — ED Notes (Signed)
Lunch provided  Pt observed with no unusual behavior  Appropriate to stimulation  No verbalized needs or concerns at this time  NAD assessed  Continue to monitor 

## 2015-03-14 NOTE — ED Provider Notes (Signed)
-----------------------------------------   7:19 AM on 03/14/2015 -----------------------------------------   BP 118/77 mmHg  Pulse 90  Temp(Src) 99.9 F (37.7 C) (Oral)  Resp 18  Ht 5\' 5"  (1.651 m)  Wt 300 lb (136.079 kg)  BMI 49.92 kg/m2  SpO2 100%  LMP   The patient had no acute events since last update.  Calm and cooperative at this time.  Disposition is pending per Psychiatry/Behavioral Medicine team recommendations.  Low grade fever, will watch.    Arnaldo NatalPaul F Gerard Bonus, MD 03/14/15 [phone]

## 2015-03-14 NOTE — ED Notes (Signed)

## 2015-03-22 ENCOUNTER — Emergency Department
Admission: EM | Admit: 2015-03-22 | Discharge: 2015-03-23 | Disposition: A | Discharge status: Home / Self Care | Payer: MEDICAID | Attending: Emergency Medicine | Admitting: Emergency Medicine

## 2015-03-22 DIAGNOSIS — F911 Conduct disorder, childhood-onset type: Secondary | ICD-10-CM | POA: Insufficient documentation

## 2015-03-22 DIAGNOSIS — Z79899 Other long term (current) drug therapy: Secondary | ICD-10-CM | POA: Insufficient documentation

## 2015-03-22 DIAGNOSIS — R451 Restlessness and agitation: Secondary | ICD-10-CM

## 2015-03-22 DIAGNOSIS — Z3202 Encounter for pregnancy test, result negative: Secondary | ICD-10-CM | POA: Insufficient documentation

## 2015-03-22 DIAGNOSIS — R4689 Other symptoms and signs involving appearance and behavior: Secondary | ICD-10-CM

## 2015-03-22 DIAGNOSIS — Z046 Encounter for general psychiatric examination, requested by authority: Secondary | ICD-10-CM | POA: Diagnosis present

## 2015-03-22 LAB — CBC
HEMATOCRIT: 30.9 % — AB (ref 35.0–47.0)
HEMOGLOBIN: 9.9 g/dL — AB (ref 12.0–16.0)
MCH: 24.1 pg — ABNORMAL LOW (ref 26.0–34.0)
MCHC: 32.2 g/dL (ref 32.0–36.0)
MCV: 75 fL — AB (ref 80.0–100.0)
PLATELETS: 167 10*3/uL (ref 150–440)
RBC: 4.12 MIL/uL (ref 3.80–5.20)
RDW: 18.4 % — ABNORMAL HIGH (ref 11.5–14.5)
WBC: 10.2 10*3/uL (ref 3.6–11.0)

## 2015-03-22 LAB — URINE DRUG SCREEN, QUALITATIVE (ARMC ONLY)
Amphetamines, Ur Screen: NOT DETECTED
BARBITURATES, UR SCREEN: NOT DETECTED
Benzodiazepine, Ur Scrn: NOT DETECTED
CANNABINOID 50 NG, UR ~~LOC~~: NOT DETECTED
Cocaine Metabolite,Ur ~~LOC~~: NOT DETECTED
MDMA (ECSTASY) UR SCREEN: NOT DETECTED
METHADONE SCREEN, URINE: NOT DETECTED
Opiate, Ur Screen: NOT DETECTED
Phencyclidine (PCP) Ur S: NOT DETECTED
Tricyclic, Ur Screen: POSITIVE — AB

## 2015-03-22 LAB — ACETAMINOPHEN LEVEL: Acetaminophen (Tylenol), Serum: <10 ug/mL — ABNORMAL LOW (ref 10–30)

## 2015-03-22 LAB — COMPREHENSIVE METABOLIC PANEL
ALK PHOS: 77 U/L (ref 38–126)
ALT: 18 U/L (ref 14–54)
AST: 28 U/L (ref 15–41)
Albumin: 3.8 g/dL (ref 3.5–5.0)
Anion gap: 9 (ref 5–15)
BILIRUBIN TOTAL: 0.5 mg/dL (ref 0.3–1.2)
BUN: 8 mg/dL (ref 6–20)
CALCIUM: 9 mg/dL (ref 8.9–10.3)
CHLORIDE: 105 mmol/L (ref 101–111)
CO2: 24 mmol/L (ref 22–32)
Creatinine, Ser: 0.75 mg/dL (ref 0.44–1.00)
GFR calc Af Amer: >60 mL/min (ref >60)
Glucose, Bld: 102 mg/dL — ABNORMAL HIGH (ref 65–99)
Potassium: 3.3 mmol/L — ABNORMAL LOW (ref 3.5–5.1)
Sodium: 138 mmol/L (ref 135–145)
TOTAL PROTEIN: 7.7 g/dL (ref 6.5–8.1)

## 2015-03-22 LAB — URINALYSIS COMPLETE WITH MICROSCOPIC (ARMC ONLY)
Bilirubin Urine: NEGATIVE
Glucose, UA: NEGATIVE mg/dL
HGB URINE DIPSTICK: NEGATIVE
Ketones, ur: NEGATIVE mg/dL
NITRITE: NEGATIVE
Protein, ur: NEGATIVE mg/dL
SPECIFIC GRAVITY, URINE: 1.006 (ref 1.005–1.030)
pH: 6 (ref 5.0–8.0)

## 2015-03-22 LAB — SALICYLATE LEVEL: Salicylate Lvl: <4 mg/dL (ref 2.8–30.0)

## 2015-03-22 LAB — POCT PREGNANCY, URINE: PREG TEST UR: NEGATIVE

## 2015-03-22 LAB — ETHANOL: Alcohol, Ethyl (B): <5 mg/dL (ref <5)

## 2015-03-22 NOTE — ED Notes (Signed)
Pt states she woke up and had a wet mattress where she was incontinent, states she flipped over the mattress and then [facility], name like Edson SnowballShana was arguing with the pt and told her she had to leave.the patient states she walked down the street to the barber shop and called the police and then walked back to the group home. Til the police showed up..states the staff there were mean to her, pt states she wanted to hit the staff member but refrained from doing so..Marland Kitchen

## 2015-03-22 NOTE — Discharge Instructions (Signed)
Aggression °Physically aggressive behavior is common among small children. When frustrated or angry, toddlers may act out. Often, they will push, bite, or hit. Most children show less physical aggression as they grow up. Their language and interpersonal skills improve, too. But continued aggressive behavior is a sign of a problem. This behavior can lead to aggression and delinquency in adolescence and adulthood. °Aggressive behavior can be psychological or physical. Forms of psychological aggression include threatening or bullying others. Forms of physical aggression include:  °· Pushing. °· Hitting. °· Slapping. °· Kicking. °· Stabbing. °· Shooting. °· Raping.  °PREVENTION  °Encouraging the following behaviors can help manage aggression: °· Respecting others and valuing differences. °· Participating in school and community functions, including sports, music, after-school programs, community groups, and volunteer work. °· Talking with an adult when they are sad, depressed, fearful, anxious, or angry. Discussions with a parent or other family member, counselor, teacher, or coach can help. °· Avoiding alcohol and drug use. °· Dealing with disagreements without aggression, such as conflict resolution. To learn this, children need parents and caregivers to model respectful communication and problem solving. °· Limiting exposure to aggression and violence, such as video games that are not age appropriate, violence in the media, or domestic violence. °Document Released: 08/02/2007 Document Revised: 12/28/2011 Document Reviewed: 12/11/2010 °ExitCare® Patient Information ©2015 [employer]. This information is not intended to replace advice given to you by your health care provider. Make sure you discuss any questions you have with your health care provider. ° °

## 2015-03-22 NOTE — ED Notes (Signed)
BEHAVIORAL HEALTH ROUNDING Patient sleeping: No. Patient alert and oriented: yes Behavior appropriate: Yes.  ; If no, describe:  Nutrition and fluids offered: Yes  Toileting and hygiene offered: Yes  Sitter present: no Law enforcement present: Yes  

## 2015-03-22 NOTE — ED Notes (Signed)
Patient assigned to appropriate care area. Patient oriented to unit/care area: Informed that, for their safety, care areas are designed for safety and monitored by staff at all times; and visiting hours explained to patient. Patient verbalizes understanding, and verbal contract for safety obtained. 

## 2015-03-22 NOTE — ED Notes (Signed)

## 2015-03-22 NOTE — ED Notes (Signed)
Pt changed in to hospital scrubs, belongings secured on unit..Marland Kitchen

## 2015-03-22 NOTE — ED Provider Notes (Addendum)
[facility] Emergency Department Provider Note  Time seen: 8:10 PM  I have reviewed the triage vital signs and the nursing notes.   HISTORY  Chief Complaint Psychiatric Evaluation    HPI Theresa [name] is a 24 y.o. female with a past medical history of anxiety, schizoaffective disorder, mental retardation who presents to the emergency department from her group home after a verbal altercation. According to the patient she had accidentally urinated on her mattress inflicted over to continue sleeping. At some point this became an issue with staff and patient became verbally aggressive and threatening so police were called. Upon arrival here the patient admits to verbal altercation denies any physical altercation, and states she does not want to return to her group home. Denies any medical complaints at this time.Denies SI or HI.     Past Medical History  Diagnosis Date  . Anxiety   . Sickle cell anemia   . Schizoaffective disorder   . Mental retardation   . Asthma   . Speech and language deficits     Patient Active Problem List   Diagnosis Date Noted  . Schizophrenia 03/13/2015  . 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 Asthma 03/13/2015  . Bipolar disorder 01/26/2015    History reviewed. No pertinent past surgical history.  Current Outpatient Rx  Name  Route  Sig  Dispense  Refill  . busPIRone (BUSPAR) 10 MG tablet   Oral   Take 1 tablet (10 mg total) by mouth 3 (three) times daily.   21 tablet   0   . divalproex (DEPAKOTE ER) 500 MG 24 hr tablet   Oral   Take 2 tablets (1,000 mg total) by mouth at bedtime.   14 tablet   0   . haloperidol (HALDOL) 5 MG tablet   Oral   Take 5 mg by mouth daily.         Marland Kitchen. ibuprofen (ADVIL,MOTRIN) 600 MG tablet   Oral   Take 600 mg by mouth 3 (three) times daily as needed for mild pain.         Marland Kitchen. lithium carbonate (ESKALITH) 450 MG CR tablet   Oral   Take 1 tablet (450 mg total) by mouth  at bedtime.   7 tablet   0   . lithium carbonate (LITHOBID) 300 MG CR tablet   Oral   Take 1 tablet (300 mg total) by mouth daily.   7 tablet   0   . medroxyPROGESTERone (DEPO-PROVERA) 150 MG/ML injection   Intramuscular   Inject 150 mg into the muscle every 3 (three) months.         . naproxen (NAPROSYN) 500 MG tablet   Oral   Take 500 mg by mouth 2 (two) times daily as needed for mild pain.         . promethazine (PHENERGAN) 25 MG tablet   Oral   Take 25 mg by mouth every 6 (six) hours as needed for nausea or vomiting.         Marland Kitchen. QUEtiapine (SEROQUEL) 100 MG tablet   Oral   Take 1 tablet (100 mg total) by mouth at bedtime.   7 tablet   0   . QUEtiapine (SEROQUEL) 200 MG tablet   Oral   Take 200 mg by mouth 2 (two) times daily.         . QUEtiapine (SEROQUEL) 25 MG tablet   Oral   Take 1 tablet (25 mg total) by mouth 2 (  two) times daily. Patient not taking: Reported on 03/13/2015   14 tablet   0     Allergies Review of patient's allergies indicates no known allergies.  No family history on file.  Social History History  Substance Use Topics  . Smoking status: Never Smoker   . Smokeless tobacco: Not on file  . Alcohol Use: No    Review of Systems Constitutional: Negative for fever. Cardiovascular: Negative for chest pain. Respiratory: Negative for shortness of breath. Gastrointestinal: Negative for abdominal pain 10-point ROS otherwise negative.  ____________________________________________   PHYSICAL EXAM:  VITAL SIGNS: ED Triage Vitals  Enc Vitals Group     BP 03/22/15 1713 120/78 mmHg     Pulse Rate 03/22/15 1713 120     Resp 03/22/15 1713 18     Temp 03/22/15 1713 98 F (36.7 C)     Temp Source 03/22/15 1713 Oral     SpO2 03/22/15 1713 98 %     Weight 03/22/15 1713 269 lb 11.2 oz (122.335 kg)     Height 03/22/15 1713  (1.676 m)     Head Cir --      Peak Flow --      Pain Score --      Pain Loc --      Pain Edu? --       Excl. in GC? --     Constitutional: Alert and oriented. Well appearing and in no distress. Eyes: Normal exam ENT   Head: Normocephalic and atraumatic. Cardiovascular: Normal rate, regular rhythm. Respiratory: Normal respiratory effort without tachypnea nor retractions. Breath sounds are clear  Gastrointestinal: Soft and nontender. No distention.  Musculoskeletal: Nontender with normal range of motion in all extremities.  Neurologic:  Normal speech and language. No gross focal neurologic deficits Skin:  Skin is warm, dry and intact.  Psychiatric: Mood and affect are normal. Speech and behavior are normal. Currently calm and cooperative.  ____________________________________________     INITIAL IMPRESSION / ASSESSMENT AND PLAN / ED COURSE  Pertinent labs & imaging results that were available during my care of the patient were reviewed by me and considered in my medical decision making (see chart for details).  Patient here after a verbal altercation at her group home. Denies any physical altercation. We will have psychiatry see the patient, and check labs.  Labs are largely within normal limits, currently awaiting psychiatric evaluation.   Group home worker states there was no altercation or argument about the patient's bed. The patient simply left the group home and called police asking them to take her here. She states she does not like the group home and did not want to go back there. I discussed this with the patient, who admits the same. The group home is willing to take her back. We will discharge the patient home. Patient is asking [place] to call her sister Juleen Starr [phone]. I have attempted to call and no one is answering the phone. We will discharge the patient home as soon as a ride becomes available to take her back to her group home, or if her sister wishes to come pick her up.  ____________________________________________   FINAL CLINICAL IMPRESSION(S) / ED  DIAGNOSES  A progression Agitation   Minna Antis, MD 03/22/15 1914  Minna Antis, MD 03/22/15 [id number]

## 2015-03-22 NOTE — ED Notes (Signed)
APPEARANCE/BEHAVIOR  calm, cooperative and adequate rapport can be established  NEURO ASSESSMENT  Orientation: time, place and person  Hallucinations: No.None noted (Hallucinations)  Speech: Normal  Gait: normal  RESPIRATORY ASSESSMENT  WNL  CARDIOVASCULAR ASSESSMENT  WNL  GASTROINTESTINAL ASSESSMENT  WNL  EXTREMITIES  ROM of all joints is normal  PLAN OF CARE  Provide calm/safe environment. Vital signs assessed twice daily. ED BHU Assessment once each 12-hour shift. Collaborate with intake RN daily or as condition indicates. Assure the ED provider has rounded once each shift. Provide and encourage hygiene. Provide redirection as needed. Assess for escalating behavior; address immediately and inform ED provider.  Assess family dynamic and appropriateness for visitation as needed: Yes. ; If necessary, describe findings:  Educate the patient/family about BHU procedures/visitation: Yes. ; If necessary, describe findings:   

## 2015-03-22 NOTE — ED Notes (Signed)

## 2015-03-22 NOTE — ED Notes (Signed)
Pt reports peeing on mattress at group home, flipping mattress over, group home staff reported pt became agitated, pt reports wanting to hurt group home satff

## 2015-03-22 NOTE — ED Notes (Signed)
BEHAVIORAL HEALTH ROUNDING  Patient sleeping: No.  Patient alert and oriented: yes  Behavior appropriate: Yes. ; If no, describe:  Nutrition and fluids offered: Yes  Toileting and hygiene offered: Yes  Sitter present: not applicable  Law enforcement present: Yes ODS  

## 2015-03-23 NOTE — ED Notes (Signed)

## 2015-03-23 NOTE — BH Assessment (Signed)
Assessment Note  Theresa [name] is an 24 y.o. female, presents to the ED via the police stating, "the staff got into my face; I wet the bed; i told her to get the hell out of my face; I want to leave there; they treat me mean; i want to hurt the staff." T/C was placed to Theresa [name] and spoke with Theresa [name], who stated, "she(client) walked off; she called the police from here(group home) and told them; she wanted to be admitted; the police called here and said that, she was with them; she said, she wanted to be admitted." Per client, "I'm not talking any more."   Axis I: Bipolar, mixed and Schizoaffective Disorder Axis II: Mental retardation, severity unknown Axis III:  Past Medical History  Diagnosis Date  . Anxiety   . Sickle cell anemia   . Schizoaffective disorder   . Mental retardation   . Asthma   . Speech and language deficits    Axis IV: housing problems and other psychosocial or environmental problems Axis V: 61-70 mild symptoms  Past Medical History:  Past Medical History  Diagnosis Date  . Anxiety   . Sickle cell anemia   . Schizoaffective disorder   . Mental retardation   . Asthma   . Speech and language deficits     History reviewed. No pertinent past surgical history.  Family History: No family history on file.  Social History:  reports that she has never smoked. She does not have any smokeless tobacco history on file. She reports that she does not drink alcohol or use illicit drugs.  Additional Social History:     CIWA: CIWA-Ar BP: 136/82 mmHg Pulse Rate: 78 COWS:    Allergies: No Known Allergies  Home Medications:  (Not in a hospital admission)  OB/GYN Status:  No LMP recorded. Patient has had an injection.  General Assessment Data Admission Status: Voluntary           Risk to self with the past 6 months Is patient at risk for suicide?: No Substance abuse history and/or treatment for substance abuse?: No         Mental Status Report Motor Activity: Unremarkable                            Advance Directives (For Healthcare) Does patient have an advance directive?: No Would patient like information on creating an advanced directive?: Yes - Educational materials given          Disposition:     On Site Evaluation by:   Reviewed with Physician:    Theresa [name] 03/23/2015 8:15 AM

## 2015-03-23 NOTE — ED Notes (Signed)
Attempt to call group home again unsuccessful. Will continue to attempt to reach group home.

## 2015-03-23 NOTE — ED Notes (Signed)
Attempt to call [name] and no answer. Voice mail was full and call back number was left.

## 2015-03-23 NOTE — ED Notes (Signed)
Attempt to call group home unsuccessful.

## 2015-03-23 NOTE — ED Notes (Signed)
Baldwin Jamaicaeached [name], owner of [name] who will send someone to pick up pt.

## 2015-04-08 ENCOUNTER — Encounter: Payer: Self-pay | Admitting: *Deleted

## 2015-04-08 ENCOUNTER — Emergency Department
Admission: EM | Admit: 2015-04-08 | Discharge: 2015-04-08 | Disposition: A | Discharge status: Home / Self Care | Payer: Medicaid Other | Attending: Emergency Medicine | Admitting: Emergency Medicine

## 2015-04-08 ENCOUNTER — Other Ambulatory Visit: Payer: Self-pay

## 2015-04-08 DIAGNOSIS — R112 Nausea with vomiting, unspecified: Secondary | ICD-10-CM

## 2015-04-08 DIAGNOSIS — R55 Syncope and collapse: Secondary | ICD-10-CM | POA: Diagnosis not present

## 2015-04-08 DIAGNOSIS — Z79899 Other long term (current) drug therapy: Secondary | ICD-10-CM | POA: Insufficient documentation

## 2015-04-08 DIAGNOSIS — F259 Schizoaffective disorder, unspecified: Secondary | ICD-10-CM | POA: Diagnosis not present

## 2015-04-08 LAB — CBC WITH DIFFERENTIAL/PLATELET
BASOS PCT: 0 %
Basophils Absolute: 0 10*3/uL (ref 0–0.1)
EOS ABS: 0.1 10*3/uL (ref 0–0.7)
EOS PCT: 1 %
HEMATOCRIT: 30.3 % — AB (ref 35.0–47.0)
Hemoglobin: 9.7 g/dL — ABNORMAL LOW (ref 12.0–16.0)
Lymphocytes Relative: 37 %
Lymphs Abs: 3.9 10*3/uL — ABNORMAL HIGH (ref 1.0–3.6)
MCH: 24.3 pg — ABNORMAL LOW (ref 26.0–34.0)
MCHC: 31.9 g/dL — ABNORMAL LOW (ref 32.0–36.0)
MCV: 76 fL — ABNORMAL LOW (ref 80.0–100.0)
Monocytes Absolute: 0.9 10*3/uL (ref 0.2–0.9)
Monocytes Relative: 9 %
NEUTROS PCT: 53 %
Neutro Abs: 5.6 10*3/uL (ref 1.4–6.5)
PLATELETS: 171 10*3/uL (ref 150–440)
RBC: 3.99 MIL/uL (ref 3.80–5.20)
RDW: 17.6 % — ABNORMAL HIGH (ref 11.5–14.5)
WBC: 10.6 10*3/uL (ref 3.6–11.0)

## 2015-04-08 LAB — COMPREHENSIVE METABOLIC PANEL
ALT: 18 U/L (ref 14–54)
ANION GAP: 6 (ref 5–15)
AST: 20 U/L (ref 15–41)
Albumin: 3.7 g/dL (ref 3.5–5.0)
Alkaline Phosphatase: 72 U/L (ref 38–126)
BUN: 6 mg/dL (ref 6–20)
CO2: 27 mmol/L (ref 22–32)
CREATININE: 0.67 mg/dL (ref 0.44–1.00)
Calcium: 9.1 mg/dL (ref 8.9–10.3)
Chloride: 106 mmol/L (ref 101–111)
GLUCOSE: 105 mg/dL — AB (ref 65–99)
POTASSIUM: 3.6 mmol/L (ref 3.5–5.1)
Sodium: 139 mmol/L (ref 135–145)
Total Bilirubin: 0.2 mg/dL — ABNORMAL LOW (ref 0.3–1.2)
Total Protein: 7.3 g/dL (ref 6.5–8.1)

## 2015-04-08 LAB — URINALYSIS COMPLETE WITH MICROSCOPIC (ARMC ONLY)
Bacteria, UA: NONE SEEN
Bilirubin Urine: NEGATIVE
Glucose, UA: NEGATIVE mg/dL
Hgb urine dipstick: NEGATIVE
Ketones, ur: NEGATIVE mg/dL
LEUKOCYTES UA: NEGATIVE
Nitrite: NEGATIVE
PROTEIN: NEGATIVE mg/dL
Specific Gravity, Urine: 1.002 — ABNORMAL LOW (ref 1.005–1.030)
pH: 6 (ref 5.0–8.0)

## 2015-04-08 LAB — TROPONIN I

## 2015-04-08 LAB — PREGNANCY, URINE: PREG TEST UR: NEGATIVE

## 2015-04-08 MED ORDER — ONDANSETRON HCL 4 MG/2ML IJ SOLN
INTRAMUSCULAR | Status: AC
  Administered 2015-04-08: 4 mg via INTRAVENOUS
  Filled 2015-04-08: qty 2

## 2015-04-08 MED ORDER — SODIUM CHLORIDE 0.9 % IV BOLUS (SEPSIS)
1000.0000 mL | Freq: Once | INTRAVENOUS | Status: AC
  Administered 2015-04-08: 1000 mL via INTRAVENOUS

## 2015-04-08 MED ORDER — ONDANSETRON HCL 4 MG/2ML IJ SOLN
4.0000 mg | Freq: Once | INTRAMUSCULAR | Status: AC
  Administered 2015-04-08: 4 mg via INTRAVENOUS

## 2015-04-08 NOTE — ED Notes (Signed)
[name] from group home called to be informed of discharge, coming to pick up pt

## 2015-04-08 NOTE — ED Provider Notes (Signed)
[facility] Emergency Department Provider Note  Time seen: 6:09 PM  I have reviewed the triage vital signs and the nursing notes.   HISTORY  Chief Complaint Near Syncope and Emesis    HPI Theresa [name] is a 24 y.o. female with a past medical history ofanxiety, sickle cell, schizoaffective, mental retardation, who presents the emergency department for nausea and vomiting 2 with a possible syncopal versus near-syncopal episode. Patient lives at a group home, according to the patient she became nauseated and vomited 2. Patient states she frequently vomits. She became lightheaded, and passed out versus possibly passed out, so EMS was called to take the patient to the emergency department. Patient denies any symptoms at this time. Denies any nausea, fever, abdominal pain, chest pain.     Past Medical History  Diagnosis Date  . Anxiety   . Sickle cell anemia   . Schizoaffective disorder   . Mental retardation   . Asthma   . Speech and language deficits     Patient Active Problem List   Diagnosis Date Noted  . Schizophrenia 03/13/2015  . 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 Asthma 03/13/2015  . Bipolar disorder 01/26/2015    History reviewed. No pertinent past surgical history.  Current Outpatient Rx  Name  Route  Sig  Dispense  Refill  . busPIRone (BUSPAR) 10 MG tablet   Oral   Take 1 tablet (10 mg total) by mouth 3 (three) times daily.   21 tablet   0   . divalproex (DEPAKOTE ER) 500 MG 24 hr tablet   Oral   Take 2 tablets (1,000 mg total) by mouth at bedtime.   14 tablet   0   . haloperidol (HALDOL) 5 MG tablet   Oral   Take 5 mg by mouth daily.         Marland Kitchen ibuprofen (ADVIL,MOTRIN) 600 MG tablet   Oral   Take 600 mg by mouth 3 (three) times daily as needed for mild pain.         Marland Kitchen lithium carbonate (ESKALITH) 450 MG CR tablet   Oral   Take 1 tablet (450 mg total) by mouth at bedtime.   7 tablet   0   . lithium  carbonate (LITHOBID) 300 MG CR tablet   Oral   Take 1 tablet (300 mg total) by mouth daily.   7 tablet   0   . medroxyPROGESTERone (DEPO-PROVERA) 150 MG/ML injection   Intramuscular   Inject 150 mg into the muscle every 3 (three) months.         . naproxen (NAPROSYN) 500 MG tablet   Oral   Take 500 mg by mouth 2 (two) times daily as needed for mild pain.         . promethazine (PHENERGAN) 25 MG tablet   Oral   Take 25 mg by mouth every 6 (six) hours as needed for nausea or vomiting.         Marland Kitchen QUEtiapine (SEROQUEL) 100 MG tablet   Oral   Take 1 tablet (100 mg total) by mouth at bedtime.   7 tablet   0   . QUEtiapine (SEROQUEL) 200 MG tablet   Oral   Take 200 mg by mouth 2 (two) times daily.         . QUEtiapine (SEROQUEL) 25 MG tablet   Oral   Take 1 tablet (25 mg total) by mouth 2 (two) times daily. Patient not taking: Reported on  03/13/2015   14 tablet   0     Allergies Review of patient's allergies indicates no known allergies.  History reviewed. No pertinent family history.  Social History History  Substance Use Topics  . Smoking status: Never Smoker   . Smokeless tobacco: Not on file  . Alcohol Use: No    Review of Systems Constitutional: Negative for fever. Cardiovascular: Negative for chest pain. Respiratory: Negative for shortness of breath. Gastrointestinal: Negative for abdominal pain Musculoskeletal: Negative for back pain. Neurological: Negative for headaches 10-point ROS otherwise negative.  ____________________________________________   PHYSICAL EXAM:  VITAL SIGNS: ED Triage Vitals  Enc Vitals Group     BP 04/08/15 1753 111/74 mmHg     Pulse Rate 04/08/15 1753 80     Resp 04/08/15 1753 20     Temp 04/08/15 1756 98.4 F (36.9 C)     Temp Source 04/08/15 1756 Oral     SpO2 04/08/15 1752 99 %     Weight 04/08/15 1753 274 lb (124.286 kg)     Height 04/08/15 1753  (1.676 m)     Head Cir --      Peak Flow --      Pain  Score 04/08/15 1754 10     Pain Loc --      Pain Edu? --      Excl. in GC? --     Constitutional: Alert and oriented. Well appearing Eyes: Normal exam ENT   Head: Normocephalic and atraumatic. No neck pain.   Mouth/Throat: Mucous membranes are moist. Cardiovascular: Normal rate, regular rhythm. No murmur. Respiratory: Normal respiratory effort without tachypnea nor retractions. Breath sounds are clear Gastrointestinal: Soft and nontender. No distention.   Musculoskeletal: Nontender with normal range of motion in all extremities. Neurologic:  Normal speech and language. No gross focal neurologic deficits Skin:  Skin is warm, dry and intact.  Psychiatric: Mood and affect are normal. Speech and behavior are normal.   ____________________________________________    EKG  EKG reviewed and interpreted by me shows normal sinus rhythm at 72 bpm, mildly widened QRS, normal axis, nonspecific ST changes. Most consistent with right bundle branch block. No ST elevations noted.  ____________________________________________   INITIAL IMPRESSION / ASSESSMENT AND PLAN / ED COURSE  Pertinent labs & imaging results that were available during my care of the patient were reviewed by me and considered in my medical decision making (see chart for details).  Patient presents with 2 episodes of nausea and vomiting, and possible near syncopal or syncopal episode. Patient denies any symptoms at this time. Head appears atraumatic, and no neck pain. We will check labs, IV hydrate, does Zofran, and closely monitor in the emergency department.  Labs are largely within normal limits including urinalysis, and negative. Pregnancy test. Patient appears well, within normal limits vitals. We will discharge home with primary care follow-up. Patient agreeable to plan.  ____________________________________________   FINAL CLINICAL IMPRESSION(S) / ED DIAGNOSES  Near-syncope Nausea and vomiting   Minna Antis, MD 04/08/15 1943

## 2015-04-08 NOTE — Discharge Instructions (Signed)
Near-Syncope Near-syncope (commonly known as near fainting) is sudden weakness, dizziness, or feeling like you might pass out. During an episode of near-syncope, you may also develop pale skin, have tunnel vision, or feel sick to your stomach (nauseous). Near-syncope may occur when getting up after sitting or while standing for a long time. It is caused by a sudden decrease in blood flow to the brain. This decrease can result from various causes or triggers, most of which are not serious. However, because near-syncope can sometimes be a sign of something serious, a medical evaluation is required. The specific cause is often not determined. HOME CARE INSTRUCTIONS  Monitor your condition for any changes. The following actions may help to alleviate any discomfort you are experiencing:  Have someone stay with you until you feel stable.  Lie down right away and prop your feet up if you start feeling like you might faint. Breathe deeply and steadily. Wait until all the symptoms have passed. Most of these episodes last only a few minutes. You may feel tired for several hours.   Drink enough fluids to keep your urine clear or pale yellow.   If you are taking blood pressure or heart medicine, get up slowly when seated or lying down. Take several minutes to sit and then stand. This can reduce dizziness.  Follow up with your health care provider as directed. SEEK IMMEDIATE MEDICAL CARE IF:   You have a severe headache.   You have unusual pain in the chest, abdomen, or back.   You are bleeding from the mouth or rectum, or you have black or tarry stool.   You have an irregular or very fast heartbeat.   You have repeated fainting or have seizure-like jerking during an episode.   You faint when sitting or lying down.   You have confusion.   You have difficulty walking.   You have severe weakness.   You have vision problems.  MAKE SURE YOU:   Understand these instructions.  Will  watch your condition.  Will get help right away if you are not doing well or get worse. Document Released: 10/05/2005 Document Revised: 10/10/2013 Document Reviewed: 03/10/2013 [facility] Patient Information 2015 [place], [place]. This information is not intended to replace advice given to you by your health care provider. Make sure you discuss any questions you have with your health care provider.  Nausea and Vomiting Nausea is a sick feeling that often comes before throwing up (vomiting). Vomiting is a reflex where stomach contents come out of your mouth. Vomiting can cause severe loss of body fluids (dehydration). Children and elderly adults can become dehydrated quickly, especially if they also have diarrhea. Nausea and vomiting are symptoms of a condition or disease. It is important to find the cause of your symptoms. CAUSES   Direct irritation of the stomach lining. This irritation can result from increased acid production (gastroesophageal reflux disease), infection, food poisoning, taking certain medicines (such as nonsteroidal anti-inflammatory drugs), alcohol use, or tobacco use.  Signals from the brain.These signals could be caused by a headache, heat exposure, an inner ear disturbance, increased pressure in the brain from injury, infection, a tumor, or a concussion, pain, emotional stimulus, or metabolic problems.  An obstruction in the gastrointestinal tract (bowel obstruction).  Illnesses such as diabetes, hepatitis, gallbladder problems, appendicitis, kidney problems, cancer, sepsis, atypical symptoms of a heart attack, or eating disorders.  Medical treatments such as chemotherapy and radiation.  Receiving medicine that makes you sleep (general anesthetic) during surgery.  DIAGNOSIS Your caregiver may ask for tests to be done if the problems do not improve after a few days. Tests may also be done if symptoms are severe or if the reason for the nausea and vomiting is not clear. Tests  may include:  Urine tests.  Blood tests.  Stool tests.  Cultures (to look for evidence of infection).  X-rays or other imaging studies. Test results can help your caregiver make decisions about treatment or the need for additional tests. TREATMENT You need to stay well hydrated. Drink frequently but in small amounts.You may wish to drink water, sports drinks, clear broth, or eat frozen ice pops or gelatin dessert to help stay hydrated.When you eat, eating slowly may help prevent nausea.There are also some antinausea medicines that may help prevent nausea. HOME CARE INSTRUCTIONS   Take all medicine as directed by your caregiver.  If you do not have an appetite, do not force yourself to eat. However, you must continue to drink fluids.  If you have an appetite, eat a normal diet unless your caregiver tells you differently.  Eat a variety of complex carbohydrates (rice, wheat, potatoes, bread), lean meats, yogurt, fruits, and vegetables.  Avoid high-fat foods because they are more difficult to digest.  Drink enough water and fluids to keep your urine clear or pale yellow.  If you are dehydrated, ask your caregiver for specific rehydration instructions. Signs of dehydration may include:  Severe thirst.  Dry lips and mouth.  Dizziness.  Dark urine.  Decreasing urine frequency and amount.  Confusion.  Rapid breathing or pulse. SEEK IMMEDIATE MEDICAL CARE IF:   You have blood or brown flecks (like coffee grounds) in your vomit.  You have black or bloody stools.  You have a severe headache or stiff neck.  You are confused.  You have severe abdominal pain.  You have chest pain or trouble breathing.  You do not urinate at least once every 8 hours.  You develop cold or clammy skin.  You continue to vomit for longer than 24 to 48 hours.  You have a fever. MAKE SURE YOU:   Understand these instructions.  Will watch your condition.  Will get help right away  if you are not doing well or get worse. Document Released: 10/05/2005 Document Revised: 12/28/2011 Document Reviewed: 03/04/2011 [facility] Patient Information 2015 [place], [place]. This information is not intended to replace advice given to you by your health care provider. Make sure you discuss any questions you have with your health care provider.    As we have discussed your workup today shows largely normal results. Please follow-up with your primary care doctor for further workup and evaluation. Return to the emergency department for any personally concerning symptoms.
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
## 2015-04-13 ENCOUNTER — Emergency Department
Admission: EM | Admit: 2015-04-13 | Discharge: 2015-04-13 | Disposition: A | Discharge status: Home / Self Care | Payer: MEDICAID | Attending: Emergency Medicine | Admitting: Emergency Medicine

## 2015-04-13 ENCOUNTER — Encounter: Payer: Self-pay | Admitting: Emergency Medicine

## 2015-04-13 DIAGNOSIS — F70 Mild intellectual disabilities: Secondary | ICD-10-CM | POA: Insufficient documentation

## 2015-04-13 DIAGNOSIS — F203 Undifferentiated schizophrenia: Secondary | ICD-10-CM | POA: Insufficient documentation

## 2015-04-13 DIAGNOSIS — Z046 Encounter for general psychiatric examination, requested by authority: Secondary | ICD-10-CM | POA: Diagnosis present

## 2015-04-13 LAB — URINALYSIS COMPLETE WITH MICROSCOPIC (ARMC ONLY)
Bacteria, UA: NONE SEEN
Bilirubin Urine: NEGATIVE
GLUCOSE, UA: NEGATIVE mg/dL
Ketones, ur: NEGATIVE mg/dL
Leukocytes, UA: NEGATIVE
NITRITE: NEGATIVE
Protein, ur: NEGATIVE mg/dL
RBC / HPF: NONE SEEN RBC/hpf (ref 0–5)
SPECIFIC GRAVITY, URINE: 1.003 — AB (ref 1.005–1.030)
pH: 7 (ref 5.0–8.0)

## 2015-04-13 LAB — COMPREHENSIVE METABOLIC PANEL
ALT: 26 U/L (ref 14–54)
AST: 28 U/L (ref 15–41)
Albumin: 3.5 g/dL (ref 3.5–5.0)
Alkaline Phosphatase: 82 U/L (ref 38–126)
Anion gap: 5 (ref 5–15)
BUN: 8 mg/dL (ref 6–20)
CHLORIDE: 108 mmol/L (ref 101–111)
CO2: 28 mmol/L (ref 22–32)
Calcium: 9.4 mg/dL (ref 8.9–10.3)
Creatinine, Ser: 0.7 mg/dL (ref 0.44–1.00)
GFR calc Af Amer: >60 mL/min (ref >60)
Glucose, Bld: 98 mg/dL (ref 65–99)
POTASSIUM: 3.4 mmol/L — AB (ref 3.5–5.1)
Sodium: 141 mmol/L (ref 135–145)
TOTAL PROTEIN: 7.4 g/dL (ref 6.5–8.1)
Total Bilirubin: 0.6 mg/dL (ref 0.3–1.2)

## 2015-04-13 LAB — CBC
HCT: 31.4 % — ABNORMAL LOW (ref 35.0–47.0)
HEMOGLOBIN: 10.2 g/dL — AB (ref 12.0–16.0)
MCH: 24.5 pg — ABNORMAL LOW (ref 26.0–34.0)
MCHC: 32.6 g/dL (ref 32.0–36.0)
MCV: 75 fL — ABNORMAL LOW (ref 80.0–100.0)
PLATELETS: 172 10*3/uL (ref 150–440)
RBC: 4.19 MIL/uL (ref 3.80–5.20)
RDW: 17.7 % — AB (ref 11.5–14.5)
WBC: 10.8 10*3/uL (ref 3.6–11.0)

## 2015-04-13 LAB — URINE DRUG SCREEN, QUALITATIVE (ARMC ONLY)
Amphetamines, Ur Screen: NOT DETECTED
BARBITURATES, UR SCREEN: NOT DETECTED
Benzodiazepine, Ur Scrn: NOT DETECTED
Cannabinoid 50 Ng, Ur ~~LOC~~: NOT DETECTED
Cocaine Metabolite,Ur ~~LOC~~: NOT DETECTED
MDMA (Ecstasy)Ur Screen: NOT DETECTED
METHADONE SCREEN, URINE: NOT DETECTED
Opiate, Ur Screen: NOT DETECTED
Phencyclidine (PCP) Ur S: NOT DETECTED
TRICYCLIC, UR SCREEN: POSITIVE — AB

## 2015-04-13 LAB — ACETAMINOPHEN LEVEL

## 2015-04-13 LAB — POCT PREGNANCY, URINE: Preg Test, Ur: NEGATIVE

## 2015-04-13 LAB — SALICYLATE LEVEL: Salicylate Lvl: <4 mg/dL (ref 2.8–30.0)

## 2015-04-13 LAB — ETHANOL: Alcohol, Ethyl (B): <5 mg/dL (ref <5)
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
## 2015-04-13 NOTE — ED Notes (Signed)
Spoke with Joni Reining, owner of [facility] who was informed that pt is being discharged and she will send someone to pick up the patient.
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
## 2015-04-13 NOTE — Discharge Instructions (Signed)
Schizophrenia °Schizophrenia is a mental illness. It may cause disturbed or disorganized thinking, speech, or behavior. People with schizophrenia have problems functioning in one or more areas of life: work, school, home, or relationships. People with schizophrenia are at increased risk for suicide, certain chronic physical illnesses, and unhealthy behaviors, such as smoking and drug use. °People who have family members with schizophrenia are at higher risk of developing the illness. Schizophrenia affects men and women equally but usually appears at an earlier age (teenage or early adult years) in men.  °SYMPTOMS °The earliest symptoms are often subtle (prodrome) and may go unnoticed until the illness becomes more severe (first-break psychosis). Symptoms of schizophrenia may be continuous or may come and go in severity. Episodes often are triggered by major life events, such as family stress, college, military service, marriage, pregnancy or child birth, divorce, or loss of a loved one. People with schizophrenia may see, hear, or feel things that do not exist (hallucinations). They may have false beliefs in spite of obvious proof to the contrary (delusions). Sometimes speech is incoherent or behavior is odd or withdrawn.  °DIAGNOSIS °Schizophrenia is diagnosed through an assessment by your caregiver. Your caregiver will ask questions about your thoughts, behavior, mood, and ability to function in daily life. Your caregiver may ask questions about your medical history and use of alcohol or drugs, including prescription medication. Your caregiver may also order blood tests and imaging exams. Certain medical conditions and substances can cause symptoms that resemble schizophrenia. Your caregiver may refer you to a mental health specialist for evaluation. There are three major criterion for a diagnosis of schizophrenia: °· Two or more of the following five symptoms are present for a month or longer: °¨ Delusions. Often  the delusions are that you are being attacked, harassed, cheated, persecuted or conspired against (persecutory delusions). °¨ Hallucinations.   °¨ Disorganized speech that does not make sense to others. °¨ Grossly disorganized (confused or unfocused) behavior or extremely overactive or underactive motor activity (catatonia). °¨ Negative symptoms such as bland or blunted emotions (flat affect), loss of will power (avolition), and withdrawal from social contacts (social isolation). °· Level of functioning in one or more major areas of life (work, school, relationships, or self-care) is markedly below the level of functioning before the onset of illness.   °· There are continuous signs of illness (either mild symptoms or decreased level of functioning) for at least 6 months or longer. °TREATMENT  °Schizophrenia is a long-term illness. It is best controlled with continuous treatment rather than treatment only when symptoms occur. The following treatments are used to manage schizophrenia: °· Medication--Medication is the most effective and important form of treatment for schizophrenia. Antipsychotic medications are usually prescribed to help manage schizophrenia. Other types of medication may be added to relieve any symptoms that may occur despite the use of antipsychotic medications. °· Counseling or talk therapy--Individual, group, or family counseling may be helpful in providing education, support, and guidance. Many people with schizophrenia also benefit from social skills and job skills (vocational) training. °A combination of medication and counseling is best for managing the disorder over time. A procedure in which electricity is applied to the brain through the scalp (electroconvulsive therapy) may be used to treat catatonic schizophrenia or schizophrenia in people who cannot take or do not respond to medication and counseling. °Document Released: 10/02/2000 Document Revised: 06/07/2013 Document Reviewed:  12/28/2012 °[employer]® Patient Information ©2015 [employer]. This information is not intended to replace advice given to you by   your health care provider. Make sure you discuss any questions you have with your health care provider. ° °

## 2015-04-17 ENCOUNTER — Emergency Department
Admission: EM | Admit: 2015-04-17 | Discharge: 2015-04-19 | Disposition: A | Discharge status: Home / Self Care | Payer: MEDICAID | Attending: Emergency Medicine | Admitting: Emergency Medicine

## 2015-04-17 ENCOUNTER — Encounter: Payer: Self-pay | Admitting: Emergency Medicine

## 2015-04-17 DIAGNOSIS — F911 Conduct disorder, childhood-onset type: Secondary | ICD-10-CM | POA: Diagnosis not present

## 2015-04-17 DIAGNOSIS — Z79899 Other long term (current) drug therapy: Secondary | ICD-10-CM | POA: Diagnosis not present

## 2015-04-17 DIAGNOSIS — Z3202 Encounter for pregnancy test, result negative: Secondary | ICD-10-CM | POA: Insufficient documentation

## 2015-04-17 DIAGNOSIS — F209 Schizophrenia, unspecified: Secondary | ICD-10-CM | POA: Insufficient documentation

## 2015-04-17 DIAGNOSIS — F319 Bipolar disorder, unspecified: Secondary | ICD-10-CM | POA: Diagnosis not present

## 2015-04-17 DIAGNOSIS — R4689 Other symptoms and signs involving appearance and behavior: Secondary | ICD-10-CM

## 2015-04-17 DIAGNOSIS — F70 Mild intellectual disabilities: Secondary | ICD-10-CM | POA: Diagnosis not present

## 2015-04-17 LAB — COMPREHENSIVE METABOLIC PANEL
ALT: 22 U/L (ref 14–54)
ANION GAP: 8 (ref 5–15)
AST: 27 U/L (ref 15–41)
Albumin: 3.8 g/dL (ref 3.5–5.0)
Alkaline Phosphatase: 74 U/L (ref 38–126)
BUN: 7 mg/dL (ref 6–20)
CALCIUM: 9.2 mg/dL (ref 8.9–10.3)
CO2: 24 mmol/L (ref 22–32)
CREATININE: 0.72 mg/dL (ref 0.44–1.00)
Chloride: 106 mmol/L (ref 101–111)
GFR calc non Af Amer: >60 mL/min (ref >60)
Glucose, Bld: 99 mg/dL (ref 65–99)
Potassium: 3.3 mmol/L — ABNORMAL LOW (ref 3.5–5.1)
Sodium: 138 mmol/L (ref 135–145)
TOTAL PROTEIN: 7.8 g/dL (ref 6.5–8.1)
Total Bilirubin: 0.5 mg/dL (ref 0.3–1.2)

## 2015-04-17 LAB — URINE DRUG SCREEN, QUALITATIVE (ARMC ONLY)
Amphetamines, Ur Screen: NOT DETECTED
BARBITURATES, UR SCREEN: NOT DETECTED
BENZODIAZEPINE, UR SCRN: NOT DETECTED
COCAINE METABOLITE, UR ~~LOC~~: NOT DETECTED
Cannabinoid 50 Ng, Ur ~~LOC~~: NOT DETECTED
MDMA (Ecstasy)Ur Screen: NOT DETECTED
Methadone Scn, Ur: NOT DETECTED
OPIATE, UR SCREEN: NOT DETECTED
Phencyclidine (PCP) Ur S: NOT DETECTED
Tricyclic, Ur Screen: POSITIVE — AB

## 2015-04-17 LAB — ETHANOL: Alcohol, Ethyl (B): <5 mg/dL (ref <5)

## 2015-04-17 LAB — URINALYSIS COMPLETE WITH MICROSCOPIC (ARMC ONLY)
Bacteria, UA: NONE SEEN
Bilirubin Urine: NEGATIVE
Glucose, UA: NEGATIVE mg/dL
HGB URINE DIPSTICK: NEGATIVE
Ketones, ur: NEGATIVE mg/dL
Leukocytes, UA: NEGATIVE
NITRITE: NEGATIVE
Protein, ur: NEGATIVE mg/dL
RBC / HPF: NONE SEEN RBC/hpf (ref 0–5)
SPECIFIC GRAVITY, URINE: 1.005 (ref 1.005–1.030)
Squamous Epithelial / LPF: NONE SEEN
pH: 6 (ref 5.0–8.0)

## 2015-04-17 LAB — ACETAMINOPHEN LEVEL

## 2015-04-17 LAB — CBC
HCT: 29.9 % — ABNORMAL LOW (ref 35.0–47.0)
Hemoglobin: 9.9 g/dL — ABNORMAL LOW (ref 12.0–16.0)
MCH: 24.6 pg — AB (ref 26.0–34.0)
MCHC: 33 g/dL (ref 32.0–36.0)
MCV: 74.8 fL — ABNORMAL LOW (ref 80.0–100.0)
PLATELETS: 163 10*3/uL (ref 150–440)
RBC: 4 MIL/uL (ref 3.80–5.20)
RDW: 17.9 % — ABNORMAL HIGH (ref 11.5–14.5)
WBC: 10.5 10*3/uL (ref 3.6–11.0)

## 2015-04-17 LAB — LITHIUM LEVEL: Lithium Lvl: 0.6 mmol/L (ref 0.60–1.20)

## 2015-04-17 LAB — SALICYLATE LEVEL: Salicylate Lvl: <4 mg/dL (ref 2.8–30.0)

## 2015-04-17 NOTE — ED Notes (Signed)
Pt ambulated to the bathroom to void and returned to her room.

## 2015-04-17 NOTE — ED Notes (Signed)
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
## 2015-04-17 NOTE — ED Notes (Signed)
POC Urine Pregnancy results: Negative

## 2015-04-17 NOTE — ED Notes (Signed)
BEHAVIORAL HEALTH ROUNDING Patient sleeping: No. Patient alert and oriented: yes Behavior appropriate: Yes.  ; If no, describe:  Nutrition and fluids offered: Yes  Toileting and hygiene offered: Yes  Sitter present: no Law enforcement present: Yes  
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
## 2015-04-17 NOTE — ED Notes (Signed)
Soft drink given to Pt.

## 2015-04-17 NOTE — BH Specialist Note (Signed)
[name] staff: Theresa KerbsPenny:  [phone] Theresa [name]: [phone]

## 2015-04-17 NOTE — ED Notes (Signed)
ED BHU PLACEMENT JUSTIFICATION Is the patient under IVC or is there intent for IVC: Yes.   Is the patient medically cleared: No. Is there vacancy in the ED BHU: Yes.   Is the population mix appropriate for patient: Yes.   Is the patient awaiting placement in inpatient or outpatient setting: Yes.   Has the patient had a psychiatric consult: No. Survey of unit performed for contraband, proper placement and condition of furniture, tampering with fixtures in bathroom, shower, and each patient room: Yes.  ; Findings:  APPEARANCE/BEHAVIOR calm and cooperative NEURO ASSESSMENT Orientation: place and person Hallucinations: No.None noted (Hallucinations) Speech: Rate:rapid at times. Gait: normal RESPIRATORY ASSESSMENT Normal expansion.  Clear to auscultation.  No rales, rhonchi, or wheezing. CARDIOVASCULAR ASSESSMENT regular rate and rhythm, S1, S2 normal, no murmur, click, rub or gallop GASTROINTESTINAL ASSESSMENT soft, nontender, BS WNL, no r/g EXTREMITIES normal strength, tone, and muscle mass PLAN OF CARE Provide calm/safe environment. Vital signs assessed twice daily. ED BHU Assessment once each 12-hour shift. Collaborate with intake RN daily or as condition indicates. Assure the ED provider has rounded once each shift. Provide and encourage hygiene. Provide redirection as needed. Assess for escalating behavior; address immediately and inform ED provider.  Assess family dynamic and appropriateness for visitation as needed: No.; If necessary, describe findings:  Educate the patient/family about BHU procedures/visitation: No.; If necessary, describe findings: Patient was just brought to the ED Quad. Family is not present.

## 2015-04-17 NOTE — ED Notes (Signed)
Patient assigned to appropriate care area. Patient oriented to unit/care area: Informed that, for their safety, care areas are designed for safety and monitored by security cameras at all times; and visiting hours explained to patient. Patient verbalizes understanding, and verbal contract for safety obtained. 
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
## 2015-04-18 DIAGNOSIS — F70 Mild intellectual disabilities: Secondary | ICD-10-CM | POA: Diagnosis not present

## 2015-04-18 DIAGNOSIS — F319 Bipolar disorder, unspecified: Secondary | ICD-10-CM

## 2015-04-18 DIAGNOSIS — F209 Schizophrenia, unspecified: Secondary | ICD-10-CM | POA: Diagnosis not present

## 2015-04-18 LAB — VALPROIC ACID LEVEL: Valproic Acid Lvl: 57 ug/mL (ref 50.0–100.0)
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
## 2015-04-18 NOTE — ED Notes (Signed)
BEHAVIORAL HEALTH ROUNDING Patient sleeping: Yes.   Patient alert and oriented: slepping Behavior appropriate: Yes.  ; If no, describe:  Nutrition and fluids offered: Yes  Toileting and hygiene offered: Yes  Sitter present: no Law enforcement present: Yes
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
## 2015-04-18 NOTE — ED Notes (Signed)
BEHAVIORAL HEALTH ROUNDING Patient sleeping: No. Patient alert and oriented: awake Behavior appropriate: Yes.  ; If no, describe:  Nutrition and fluids offered: Yes  Toileting and hygiene offered: Yes  Sitter present: no Law enforcement present: Yes  

## 2015-04-18 NOTE — ED Notes (Signed)
Patient assigned to appropriate care area. Patient oriented to unit/care area: Informed that, for their safety, care areas are designed for safety and monitored by security cameras at all times; and visiting hours explained to patient. Patient verbalizes understanding, and verbal contract for safety obtained. 

## 2015-04-18 NOTE — ED Notes (Signed)
Pt. Noted in room. No complaints or concerns voiced. No acute distress noted. Pt. Yelling out occassionally while watching TV. Will continue to monitor with security cameras. Q 15 minute rounds continue.

## 2015-04-18 NOTE — ED Notes (Signed)
Sandwich and soft drink given.  

## 2015-04-18 NOTE — ED Notes (Addendum)
[facility]. Pt. Sleeping, respirations regular and unlabored. Will continue to monitor for safety via security cameras and Q 15 minute checks.

## 2015-04-18 NOTE — Progress Notes (Signed)
CSW completed medical necessity on the wrong patient please delete.     Maryelizabeth Rowanressa Charlese Gruetzmacher, MSW, LCSW, LCAS Clinical Social Worker [phone]`

## 2015-04-18 NOTE — ED Notes (Signed)
Patient denies pain and is resting comfortably.  

## 2015-04-18 NOTE — ED Notes (Signed)
BEHAVIORAL HEALTH ROUNDING Patient sleeping: Yes.   Patient alert and oriented: awake Behavior appropriate: Yes.  ; If no, describe:  Nutrition and fluids offered: Yes  Toileting and hygiene offered: Yes  Sitter present: no Law enforcement present: Yes  

## 2015-04-18 NOTE — ED Notes (Signed)
MD at bedside. 

## 2015-04-18 NOTE — Discharge Instructions (Signed)
Aggression °Physically aggressive behavior is common among small children. When frustrated or angry, toddlers may act out. Often, they will push, bite, or hit. Most children show less physical aggression as they grow up. Their language and interpersonal skills improve, too. But continued aggressive behavior is a sign of a problem. This behavior can lead to aggression and delinquency in adolescence and adulthood. °Aggressive behavior can be psychological or physical. Forms of psychological aggression include threatening or bullying others. Forms of physical aggression include:  °· Pushing. °· Hitting. °· Slapping. °· Kicking. °· Stabbing. °· Shooting. °· Raping.  °PREVENTION  °Encouraging the following behaviors can help manage aggression: °· Respecting others and valuing differences. °· Participating in school and community functions, including sports, music, after-school programs, community groups, and volunteer work. °· Talking with an adult when they are sad, depressed, fearful, anxious, or angry. Discussions with a parent or other family member, counselor, teacher, or coach can help. °· Avoiding alcohol and drug use. °· Dealing with disagreements without aggression, such as conflict resolution. To learn this, children need parents and caregivers to model respectful communication and problem solving. °· Limiting exposure to aggression and violence, such as video games that are not age appropriate, violence in the media, or domestic violence. °Document Released: 08/02/2007 Document Revised: 12/28/2011 Document Reviewed: 12/11/2010 °ExitCare® Patient Information ©2015 [employer]. This information is not intended to replace advice given to you by your health care provider. Make sure you discuss any questions you have with your health care provider. ° °Schizophrenia °Schizophrenia is a mental illness. It may cause disturbed or disorganized thinking, speech, or behavior. People with schizophrenia have problems  functioning in one or more areas of life: work, school, home, or relationships. People with schizophrenia are at increased risk for suicide, certain chronic physical illnesses, and unhealthy behaviors, such as smoking and drug use. °People who have family members with schizophrenia are at higher risk of developing the illness. Schizophrenia affects men and women equally but usually appears at an earlier age (teenage or early adult years) in men.  °SYMPTOMS °The earliest symptoms are often subtle (prodrome) and may go unnoticed until the illness becomes more severe (first-break psychosis). Symptoms of schizophrenia may be continuous or may come and go in severity. Episodes often are triggered by major life events, such as family stress, college, military service, marriage, pregnancy or child birth, divorce, or loss of a loved one. People with schizophrenia may see, hear, or feel things that do not exist (hallucinations). They may have false beliefs in spite of obvious proof to the contrary (delusions). Sometimes speech is incoherent or behavior is odd or withdrawn.  °DIAGNOSIS °Schizophrenia is diagnosed through an assessment by your caregiver. Your caregiver will ask questions about your thoughts, behavior, mood, and ability to function in daily life. Your caregiver may ask questions about your medical history and use of alcohol or drugs, including prescription medication. Your caregiver may also order blood tests and imaging exams. Certain medical conditions and substances can cause symptoms that resemble schizophrenia. Your caregiver may refer you to a mental health specialist for evaluation. There are three major criterion for a diagnosis of schizophrenia: °· Two or more of the following five symptoms are present for a month or longer: °¨ Delusions. Often the delusions are that you are being attacked, harassed, cheated, persecuted or conspired against (persecutory delusions). °¨ Hallucinations.   °¨ Disorganized  speech that does not make sense to others. °¨ Grossly disorganized (confused or unfocused) behavior or extremely overactive or underactive motor   activity (catatonia).  Negative symptoms such as bland or blunted emotions (flat affect), loss of will power (avolition), and withdrawal from social contacts (social isolation).  Level of functioning in one or more major areas of life (work, school, relationships, or self-care) is markedly below the level of functioning before the onset of illness.   There are continuous signs of illness (either mild symptoms or decreased level of functioning) for at least 6 months or longer. TREATMENT  Schizophrenia is a long-term illness. It is best controlled with continuous treatment rather than treatment only when symptoms occur. The following treatments are used to manage schizophrenia:  Medication--Medication is the most effective and important form of treatment for schizophrenia. Antipsychotic medications are usually prescribed to help manage schizophrenia. Other types of medication may be added to relieve any symptoms that may occur despite the use of antipsychotic medications.  Counseling or talk therapy--Individual, group, or family counseling may be helpful in providing education, support, and guidance. Many people with schizophrenia also benefit from social skills and job skills (vocational) training. A combination of medication and counseling is best for managing the disorder over time. A procedure in which electricity is applied to the brain through the scalp (electroconvulsive therapy) may be used to treat catatonic schizophrenia or schizophrenia in people who cannot take or do not respond to medication and counseling. Document Released: 10/02/2000 Document Revised: 06/07/2013 Document Reviewed: 12/28/2012 [facility] LPExit[facility] Patient Information 2015 [place], [place]. This information is not intended to replace advice given to you by your health care provider. Make sure  you discuss any questions you have with your health care provider.

## 2015-04-18 NOTE — ED Notes (Signed)
Pt. Noted sleeping in room. No complaints or concerns voiced. No distress or abnormal behavior noted. Will continue to monitor with security cameras. Q 15 minute rounds continue. 

## 2015-04-18 NOTE — ED Provider Notes (Signed)
Patient has been cleared by Dr. Toni Amendlapacs from psychiatry for discharge.  Theresa FilbertJonathan E Shamecca Whitebread, MD 04/18/15 [phone]

## 2015-04-18 NOTE — ED Notes (Signed)

## 2015-04-18 NOTE — ED Notes (Signed)
Pt had episode of urinary incontinence, scrubs and linens changed

## 2015-04-18 NOTE — ED Notes (Signed)
BEHAVIORAL HEALTH ROUNDING Patient sleeping: Yes.   Patient alert and oriented: Pt is sleeping.  Behavior appropriate: Pt is sleeping Nutrition and fluids offered: Pt is sleeping.  Toileting and hygiene offered: Pt is sleeping.  Sitter present: yes Law enforcement present: Yes  

## 2015-04-18 NOTE — ED Notes (Signed)
Intake will address placement issues tomorrow

## 2015-04-18 NOTE — ED Notes (Signed)
BEHAVIORAL HEALTH ROUNDING Patient sleeping: Yes.   Patient alert and oriented: sleeping Behavior appropriate: Yes.  ; If no, describe:  Nutrition and fluids offered: Yes  Toileting and hygiene offered: Yes  Sitter present: no Law enforcement present: Yes  

## 2015-04-19 DIAGNOSIS — F70 Mild intellectual disabilities: Secondary | ICD-10-CM

## 2015-04-19 LAB — POCT PREGNANCY, URINE: Preg Test, Ur: NEGATIVE

## 2015-04-19 MED ORDER — ONDANSETRON 8 MG PO TBDP
4.0000 mg | ORAL_TABLET | Freq: Once | ORAL | Status: AC
  Administered 2015-04-19: 4 mg via ORAL

## 2015-04-19 MED ORDER — ONDANSETRON 4 MG PO TBDP
ORAL_TABLET | ORAL | Status: AC
  Administered 2015-04-19: 4 mg via ORAL
  Filled 2015-04-19: qty 1

## 2015-04-19 NOTE — ED Notes (Signed)
BEHAVIORAL HEALTH ROUNDING Patient sleeping: No. Patient alert and oriented: yes Behavior appropriate: Yes.  ; If no, describe:  Nutrition and fluids offered: Yes  Toileting and hygiene offered: Yes  Sitter present: no Law enforcement present: Yes  

## 2015-04-19 NOTE — ED Provider Notes (Signed)
-----------------------------------------   6:16 AM on 04/19/2015 -----------------------------------------   BP 125/87 mmHg  Pulse 95  Temp(Src) 97.6 F (36.4 C) (Oral)  Resp 18  Ht 5\' 6"  (1.676 m)  Wt 274 lb (124.286 kg)  BMI 44.25 kg/m2  SpO2 100%  The patient had no acute events since last update.  Calm and cooperative at this time.  Disposition is pending per Psychiatry/Behavioral Medicine team recommendations.     Irean HongJade J Naly Schwanz, MD 04/19/15 [phone]

## 2015-04-19 NOTE — ED Notes (Signed)
BEHAVIORAL HEALTH ROUNDING Patient sleeping: No. Patient alert and oriented: yes Behavior appropriate: Yes.  ; If no, describe:  Nutrition and fluids offered: Yes  Toileting and hygiene offered: Yes  Sitter present: no Law enforcement present: Yes, ODS 

## 2015-04-19 NOTE — ED Notes (Signed)
Pt asking to call her sister christina, line is busy will try again later. No complaints or concerns voiced. No distress or abnormal behavior noted. Will continue to monitor with security cameras. Q 15 minute rounds continue.

## 2015-04-19 NOTE — ED Notes (Signed)
Pt. Noted sleeping in room. No complaints or concerns voiced. No distress or abnormal behavior noted. Will continue to monitor with security cameras. Q 15 minute rounds continue. 

## 2015-04-19 NOTE — ED Notes (Signed)
Pt sitting on her bed watching tv. NAD observed.

## 2015-04-19 NOTE — ED Notes (Signed)
Sandwich and soft drink given.  

## 2015-04-19 NOTE — ED Notes (Signed)
Pt provided with dinner tray and drink.  

## 2015-04-19 NOTE — Consult Note (Signed)
  Psychiatry: Follow-up for 24 year old woman with a history of bipolar disorder and mild chronic intellectual impairment. She has no new complaints today. Behavior has been calm and cooperative. No physical complaints. Not aggressive or threatening.  Vital signs are stable. Cooperative with medicine.  Patient is no longer on commitment and I have suggested she be discharged back to her group home. I believe we are still in negotiation to have them come and pick her up. She has outpatient treatment already in place and will follow-up there.

## 2015-04-19 NOTE — ED Notes (Signed)
Pt was d/c to sister and guardian [name]. E-signature not done. Advised Ms.Simms to go to pt's former group home to get her medications and start pt on them asap. D/C instructions reviewed with sister/guardian and list of group homes provided from Child psychotherapistsocial worker. Ms.Simms verbalizes understanding of d/c instructions and has no further needs or concerns. Calvin from intake also with this RN during d/c for any questions or concerns from Ms.Simms.

## 2015-04-19 NOTE — ED Notes (Signed)
Pt with steady gait walked through the day room again and back to her room. Pt is lying on her bed watching tv.

## 2015-04-19 NOTE — ED Notes (Signed)
Pt asking to speak with her sister again, unable to reach her at this time

## 2015-04-19 NOTE — ED Notes (Signed)
Pt sitting in day room watching tv laughing, talking with nurse tech during rounds.

## 2015-04-19 NOTE — ED Notes (Signed)
Report received from Gary RN. Pt resting quietly.Will continue to monitor for safety via security cameras and Q 15 minute checks. 

## 2015-04-19 NOTE — ED Notes (Signed)
Pt requesting a drink. Pt provided with ice water. Pt sitting in day room watching tv.

## 2015-04-19 NOTE — ED Notes (Signed)
Pt report received from Myrtha MantisBrandy D RN. Pt care assumed. Pt sitting in day room watching tv at this time.

## 2015-04-19 NOTE — Progress Notes (Signed)
Call to patient's guardian (sister Lawernce KeasChristian Sims [phone])  States she understands patient is not allowed to return to the group home.    Christian informed CSW she will come pick up patient when she gets off of work today.  Anticipate pick up around 8:00 pm.  Sister lives in [place].  Sammuel Hineseborah Arboleda. Theresia MajorsLCSWA, MSW Clinical Social Work Department Emergency Room [phone] 11:40 AM

## 2015-04-19 NOTE — ED Notes (Addendum)
Pt vomited x 1, states that she "feels sick, my sickle cell is acting up". Pt has no c/o pain other than "stomach doesn't feel good". Pt given clean shirt as she got vomit on her shirt. After changing her shirt in the bathroom pt came out and carefully laid herself on the floor of the day room protecting her head by laying her head down on her arm. This RN and ODS officer out to speak with pt. Pt ignored us for the few moments about getting up off the floor but did pick her self up off the floor and sat in a recliner. MD York CeriseForbach advised of events, medication ordered and administered. VSS. Pt rested in the recliner for a few minutes and has now moved to her room with a steady gait and is sitting on the side of the bed. Looking at pt's chart it is noted in triage that she has a h/o anxiety and has phenergan listed as a home med. Pt has repeatedly asked about going home and has been advised each time that her sister will come get her when she gets off work at [employer].

## 2015-05-12 ENCOUNTER — Other Ambulatory Visit: Payer: Self-pay

## 2015-05-12 ENCOUNTER — Encounter: Payer: Self-pay | Admitting: Emergency Medicine

## 2015-05-12 ENCOUNTER — Emergency Department
Admission: EM | Admit: 2015-05-12 | Discharge: 2015-05-12 | Disposition: A | Discharge status: Home / Self Care | Payer: Medicaid Other | Attending: Emergency Medicine | Admitting: Emergency Medicine

## 2015-05-12 ENCOUNTER — Emergency Department: Payer: Medicaid Other

## 2015-05-12 DIAGNOSIS — F70 Mild intellectual disabilities: Secondary | ICD-10-CM | POA: Insufficient documentation

## 2015-05-12 DIAGNOSIS — Z7951 Long term (current) use of inhaled steroids: Secondary | ICD-10-CM | POA: Insufficient documentation

## 2015-05-12 DIAGNOSIS — F209 Schizophrenia, unspecified: Secondary | ICD-10-CM

## 2015-05-12 DIAGNOSIS — F250 Schizoaffective disorder, bipolar type: Secondary | ICD-10-CM | POA: Diagnosis not present

## 2015-05-12 DIAGNOSIS — Z760 Encounter for issue of repeat prescription: Secondary | ICD-10-CM | POA: Diagnosis not present

## 2015-05-12 DIAGNOSIS — F319 Bipolar disorder, unspecified: Secondary | ICD-10-CM

## 2015-05-12 DIAGNOSIS — J45901 Unspecified asthma with (acute) exacerbation: Secondary | ICD-10-CM | POA: Diagnosis not present

## 2015-05-12 DIAGNOSIS — Z79899 Other long term (current) drug therapy: Secondary | ICD-10-CM | POA: Diagnosis not present

## 2015-05-12 LAB — COMPREHENSIVE METABOLIC PANEL
ALBUMIN: 3.7 g/dL (ref 3.5–5.0)
ALT: 17 U/L (ref 14–54)
ANION GAP: 6 (ref 5–15)
AST: 22 U/L (ref 15–41)
Alkaline Phosphatase: 72 U/L (ref 38–126)
BILIRUBIN TOTAL: 0.4 mg/dL (ref 0.3–1.2)
BUN: 6 mg/dL (ref 6–20)
CO2: 26 mmol/L (ref 22–32)
Calcium: 9.2 mg/dL (ref 8.9–10.3)
Chloride: 109 mmol/L (ref 101–111)
Creatinine, Ser: 0.6 mg/dL (ref 0.44–1.00)
GFR calc non Af Amer: >60 mL/min (ref >60)
GLUCOSE: 87 mg/dL (ref 65–99)
Potassium: 3.6 mmol/L (ref 3.5–5.1)
SODIUM: 141 mmol/L (ref 135–145)
Total Protein: 7.5 g/dL (ref 6.5–8.1)

## 2015-05-12 LAB — LITHIUM LEVEL: Lithium Lvl: 0.3 mmol/L — ABNORMAL LOW (ref 0.60–1.20)

## 2015-05-12 LAB — SALICYLATE LEVEL: Salicylate Lvl: <4 mg/dL (ref 2.8–30.0)

## 2015-05-12 LAB — ACETAMINOPHEN LEVEL: Acetaminophen (Tylenol), Serum: <10 ug/mL — ABNORMAL LOW (ref 10–30)

## 2015-05-12 LAB — ETHANOL: Alcohol, Ethyl (B): <5 mg/dL (ref <5)

## 2015-05-12 LAB — TROPONIN I: Troponin I: <0.03 ng/mL (ref <0.031)

## 2015-05-12 LAB — VALPROIC ACID LEVEL: Valproic Acid Lvl: <10 ug/mL — ABNORMAL LOW (ref 50.0–100.0)

## 2015-05-12 MED ORDER — DIVALPROEX SODIUM ER 500 MG PO TB24: 1000.0000 mg | ORAL_TABLET | Freq: Every day | ORAL | Status: DC

## 2015-05-12 MED ORDER — DIVALPROEX SODIUM ER 500 MG PO TB24
1000.0000 mg | ORAL_TABLET | Freq: Every day | ORAL | Status: DC
  Administered 2015-05-12: 1000 mg via ORAL
  Filled 2015-05-12: qty 2

## 2015-05-12 MED ORDER — LITHIUM CARBONATE ER 300 MG PO TBCR: 300.0000 mg | EXTENDED_RELEASE_TABLET | Freq: Every day | ORAL | Status: DC

## 2015-05-12 MED ORDER — LITHIUM CARBONATE ER 450 MG PO TBCR
450.0000 mg | EXTENDED_RELEASE_TABLET | Freq: Two times a day (BID) | ORAL | Status: DC
  Administered 2015-05-12: 450 mg via ORAL
  Filled 2015-05-12 (×3): qty 1

## 2015-05-12 MED ORDER — LITHIUM CARBONATE ER 450 MG PO TBCR: 450.0000 mg | EXTENDED_RELEASE_TABLET | Freq: Every day | ORAL | Status: DC

## 2015-05-12 NOTE — ED Notes (Signed)
EMS states called to residence due to patient being out of medications.  Patient had been a resident of a group home, but has been living with her friend for "a while".  Patient has been out of medications "for a while".  Friend does not know all medications but provided two medication packages (lithium and divalpro ER).  Patient is Awake, alert, and oriented.  Patient states she had a seizure and has shortness of breath today.

## 2015-05-12 NOTE — Discharge Instructions (Signed)
Medication Refill, Emergency Department °We have refilled your medication today as a courtesy to you. It is best for your medical care, however, to take care of getting refills done through your primary caregiver's office. They have your records and can do a better job of follow-up than we can in the emergency department. °On maintenance medications, we often only prescribe enough medications to get you by until you are able to see your regular caregiver. This is a more expensive way to refill medications. °In the future, please plan for refills so that you will not have to use the emergency department for this. °Thank you for your help. Your help allows us to better take care of the daily emergencies that enter our department. °Document Released: 01/22/2004 Document Revised: 12/28/2011 Document Reviewed: 01/12/2014 °ExitCare® Patient Information ©2015 [employer]. This information is not intended to replace advice given to you by your health care provider. Make sure you discuss any questions you have with your health care provider. ° °

## 2015-05-12 NOTE — ED Provider Notes (Signed)
ED ECG REPORT I, QUALE, MARK, the attending physician, personally viewed and interpreted this ECG.  Date: 05/12/2015 EKG Time: 1735 Rate: 65 Rhythm: normal sinus rhythm QRS Axis: normal Intervals: Slightly prolonged QRS at 136 ST/T Wave abnormalities: normal Conduction Disutrbances: Right bundle branch block with expected T wave inversions in anteroseptal leads. Narrative Interpretation: Right bundle branch block with no ischemic pathology   Sharyn Creamer, MD 05/12/15 [phone]

## 2015-05-12 NOTE — ED Notes (Signed)
AAOx3.  Skin warm and dry.  NAD.  D/C home 

## 2015-05-12 NOTE — ED Provider Notes (Signed)
[facility] Emergency Department Provider Note  ____________________________________________  Time seen:  3:45 PM  I have reviewed the triage vital signs and the nursing notes.   HISTORY  Chief Complaint Medication Refill   HPI Theresa [name] is a 24 y.o. female is here with complaint of no medication. She arrived per EMS to the emergency room today. She also states that she "had a seizure" that EMS was able to talk her out of and that she is out of breath while lying on her stomach last night. Her breathing got much better when she laid on her left side. Patient has a long history of schizoaffective disorder, mental retardation, and bipolar disorder. Patient is a very poor historian. She is unable to give information about how long she has been out of her medication. The 2 medications that she is aware of what brought in by empty packages, being Depakote ER andlithium. She is unable to give a history of what other medications she is out and how long she has been out of them.  She states she was living in a group home and decided "to live with a friend". There is no one with her at present to add to her history. Currently she denies any shortness of breath or chest pain. She denies any injury.   Past Medical History  Diagnosis Date  . Anxiety   . Sickle cell anemia   . Schizoaffective disorder   . Mental retardation   . Asthma   . Speech and language deficits     Patient Active Problem List   Diagnosis Date Noted  . Schizophrenia 03/13/2015  . 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 Asthma 03/13/2015  . Bipolar disorder 01/26/2015    History reviewed. No pertinent past surgical history.  Current Outpatient Rx  Name  Route  Sig  Dispense  Refill  . busPIRone (BUSPAR) 10 MG tablet   Oral   Take 1 tablet (10 mg total) by mouth 3 (three) times daily.   21 tablet   0   . divalproex (DEPAKOTE ER) 500 MG 24 hr tablet   Oral   Take 2 tablets  (1,000 mg total) by mouth at bedtime.   14 tablet   0   . haloperidol (HALDOL) 5 MG tablet   Oral   Take 5 mg by mouth daily.         Marland Kitchen ibuprofen (ADVIL,MOTRIN) 600 MG tablet   Oral   Take 600 mg by mouth 3 (three) times daily as needed for mild pain.         Marland Kitchen lithium carbonate (ESKALITH) 450 MG CR tablet   Oral   Take 1 tablet (450 mg total) by mouth at bedtime.   7 tablet   0   . lithium carbonate (LITHOBID) 300 MG CR tablet   Oral   Take 1 tablet (300 mg total) by mouth daily.   7 tablet   0   . medroxyPROGESTERone (DEPO-PROVERA) 150 MG/ML injection   Intramuscular   Inject 150 mg into the muscle every 3 (three) months.         . naproxen (NAPROSYN) 500 MG tablet   Oral   Take 500 mg by mouth 2 (two) times daily as needed for mild pain.         . promethazine (PHENERGAN) 25 MG tablet   Oral   Take 25 mg by mouth every 6 (six) hours as needed for nausea or vomiting.         Marland Kitchen  QUEtiapine (SEROQUEL) 100 MG tablet   Oral   Take 1 tablet (100 mg total) by mouth at bedtime.   7 tablet   0   . QUEtiapine (SEROQUEL) 200 MG tablet   Oral   Take 200 mg by mouth 2 (two) times daily.         . QUEtiapine (SEROQUEL) 25 MG tablet   Oral   Take 1 tablet (25 mg total) by mouth 2 (two) times daily. Patient not taking: Reported on 03/13/2015   14 tablet   0     Allergies Review of patient's allergies indicates no known allergies.  History reviewed. No pertinent family history.  Social History History  Substance Use Topics  . Smoking status: Never Smoker   . Smokeless tobacco: Not on file  . Alcohol Use: No    Review of Systems Constitutional: No fever/chills Eyes: No visual changes. ENT: No sore throat. Cardiovascular: Denies chest pain. Respiratory: Positive shortness of breath. Gastrointestinal: No abdominal pain.  No nausea, no vomiting.  No diarrhea.  No constipation. Genitourinary: Negative for dysuria. Musculoskeletal: Negative for back  pain. Skin: Negative for rash. Neurological: Negative for headaches, focal weakness or numbness. Psychiatric:Positive for schizophrenia, mental retardation, bipolar disorder. 10-point ROS otherwise negative.  ____________________________________________   PHYSICAL EXAM:  VITAL SIGNS: ED Triage Vitals  Enc Vitals Group     BP --      Pulse --      Resp --      Temp --      Temp src --      SpO2 --      Weight --      Height --      Head Cir --      Peak Flow --      Pain Score --      Pain Loc --      Pain Edu? --      Excl. in GC? --     Constitutional: Alert and oriented to place and person. Well appearing and in no acute distress. Patient currently is watching TV, is not easily distracted Eyes: Conjunctivae are normal. PERRL. EOMI. Head: Atraumatic. Nose: No congestion/rhinnorhea. Mouth/Throat: Mucous membranes are moist. Neck: No stridor.  Supple Hematological/Lymphatic/Immunilogical: No cervical lymphadenopathy. Cardiovascular: Normal rate, regular rhythm. Grossly normal heart sounds.  Good peripheral circulation. Respiratory: Normal respiratory effort.  No retractions. Lungs CTAB. Gastrointestinal: Soft and nontender. No distention. Musculoskeletal: Moves upper extremities without any difficulty No lower extremity tenderness nor edema.  No joint effusions. Neurologic:  Slow speech and language. No gross focal neurologic deficits are appreciated. Gait was not tested. Skin:  Skin is warm, dry and intact. No rash noted. Psychiatric: Mood and affect are flat. Speech is slow and behavior are normal.  ____________________________________________   LABS (all labs ordered are listed, but only abnormal results are displayed)  Labs Reviewed  LITHIUM LEVEL - Abnormal; Notable for the following:    Lithium Lvl 0.30 (*)    All other components within normal limits  VALPROIC ACID LEVEL - Abnormal; Notable for the following:    Valproic Acid Lvl <10 (*)    All other  components within normal limits  ACETAMINOPHEN LEVEL - Abnormal; Notable for the following:    Acetaminophen (Tylenol), Serum <10 (*)    All other components within normal limits  COMPREHENSIVE METABOLIC PANEL  ETHANOL  SALICYLATE LEVEL  TROPONIN I   ____________________________________________  EKG  Per Dr. Fanny Bien ____________________________________________  RADIOLOGY  Chest x-ray showed  no active cardiopulmonary disease per radiologist ____________________________________________   PROCEDURES  Procedure(s) performed: None  Critical Care performed: No  ____________________________________________   INITIAL IMPRESSION / ASSESSMENT AND PLAN / ED COURSE  Pertinent labs & imaging results that were available during my care of the patient were reviewed by me and considered in my medical decision making (see chart for details).  Patient was ambulatory while in the emergency room, watching TV, and drinking fluids. Lab work indicates the patient has not had her medication and a prescription for one week was written. Patient instructed to have her primary care doctor write  prescriptions for her medication and monitor her medications. ____________________________________________   FINAL CLINICAL IMPRESSION(S) / ED DIAGNOSES  Final diagnoses:  Medication refill  Schizophrenia, unspecified type  Bipolar affective disorder, most recent episode unspecified type, remission status unspecified      Tommi Rumps, PA-C 05/12/15 1958  Sharman Cheek, MD 05/12/15 2153

## 2015-06-02 ENCOUNTER — Emergency Department
Admission: EM | Admit: 2015-06-02 | Discharge: 2015-06-10 | Disposition: A | Discharge status: Home / Self Care | Payer: MEDICAID | Attending: Emergency Medicine | Admitting: Emergency Medicine

## 2015-06-02 DIAGNOSIS — R45851 Suicidal ideations: Secondary | ICD-10-CM | POA: Diagnosis present

## 2015-06-02 DIAGNOSIS — Z793 Long term (current) use of hormonal contraceptives: Secondary | ICD-10-CM | POA: Insufficient documentation

## 2015-06-02 DIAGNOSIS — Z79899 Other long term (current) drug therapy: Secondary | ICD-10-CM | POA: Diagnosis not present

## 2015-06-02 DIAGNOSIS — F259 Schizoaffective disorder, unspecified: Secondary | ICD-10-CM | POA: Diagnosis not present

## 2015-06-02 DIAGNOSIS — Z3202 Encounter for pregnancy test, result negative: Secondary | ICD-10-CM | POA: Insufficient documentation

## 2015-06-02 DIAGNOSIS — F319 Bipolar disorder, unspecified: Secondary | ICD-10-CM | POA: Diagnosis not present

## 2015-06-02 DIAGNOSIS — M545 Low back pain: Secondary | ICD-10-CM | POA: Diagnosis not present

## 2015-06-02 DIAGNOSIS — F419 Anxiety disorder, unspecified: Secondary | ICD-10-CM | POA: Insufficient documentation

## 2015-06-02 DIAGNOSIS — N390 Urinary tract infection, site not specified: Secondary | ICD-10-CM | POA: Diagnosis not present

## 2015-06-02 LAB — URINALYSIS COMPLETE WITH MICROSCOPIC (ARMC ONLY)
BILIRUBIN URINE: NEGATIVE
GLUCOSE, UA: NEGATIVE mg/dL
Ketones, ur: NEGATIVE mg/dL
NITRITE: NEGATIVE
PH: 6 (ref 5.0–8.0)
PROTEIN: 30 mg/dL — AB
Specific Gravity, Urine: 1.005 (ref 1.005–1.030)

## 2015-06-02 LAB — COMPREHENSIVE METABOLIC PANEL
ALBUMIN: 3.9 g/dL (ref 3.5–5.0)
ALT: 15 U/L (ref 14–54)
ANION GAP: 9 (ref 5–15)
AST: 20 U/L (ref 15–41)
Alkaline Phosphatase: 73 U/L (ref 38–126)
BUN: 6 mg/dL (ref 6–20)
CALCIUM: 9.3 mg/dL (ref 8.9–10.3)
CO2: 20 mmol/L — ABNORMAL LOW (ref 22–32)
Chloride: 110 mmol/L (ref 101–111)
Creatinine, Ser: 0.67 mg/dL (ref 0.44–1.00)
GFR calc non Af Amer: >60 mL/min (ref >60)
Glucose, Bld: 96 mg/dL (ref 65–99)
POTASSIUM: 3.4 mmol/L — AB (ref 3.5–5.1)
SODIUM: 139 mmol/L (ref 135–145)
Total Bilirubin: 0.6 mg/dL (ref 0.3–1.2)
Total Protein: 8.2 g/dL — ABNORMAL HIGH (ref 6.5–8.1)

## 2015-06-02 LAB — CBC
HCT: 28.6 % — ABNORMAL LOW (ref 35.0–47.0)
HEMOGLOBIN: 9.2 g/dL — AB (ref 12.0–16.0)
MCH: 23 pg — AB (ref 26.0–34.0)
MCHC: 32.4 g/dL (ref 32.0–36.0)
MCV: 71 fL — ABNORMAL LOW (ref 80.0–100.0)
Platelets: 222 10*3/uL (ref 150–440)
RBC: 4.02 MIL/uL (ref 3.80–5.20)
RDW: 19.3 % — AB (ref 11.5–14.5)
WBC: 14.2 10*3/uL — ABNORMAL HIGH (ref 3.6–11.0)

## 2015-06-02 LAB — ACETAMINOPHEN LEVEL

## 2015-06-02 LAB — ETHANOL: Alcohol, Ethyl (B): 6 mg/dL — ABNORMAL HIGH (ref <5)

## 2015-06-02 LAB — SALICYLATE LEVEL

## 2015-06-02 LAB — POCT PREGNANCY, URINE: PREG TEST UR: NEGATIVE

## 2015-06-02 MED ORDER — CEPHALEXIN 500 MG PO CAPS
500.0000 mg | ORAL_CAPSULE | Freq: Two times a day (BID) | ORAL | Status: DC
  Administered 2015-06-02 – 2015-06-10 (×16): 500 mg via ORAL
  Filled 2015-06-02 (×17): qty 1

## 2015-06-02 MED ORDER — SODIUM CHLORIDE 0.9 % IV BOLUS (SEPSIS)
1000.0000 mL | Freq: Once | INTRAVENOUS | Status: AC
  Administered 2015-06-02: 1000 mL via INTRAVENOUS

## 2015-06-02 MED ORDER — DEXTROSE 5 % IV SOLN
1.0000 g | Freq: Once | INTRAVENOUS | Status: AC
  Administered 2015-06-02: 1 g via INTRAVENOUS
  Filled 2015-06-02: qty 10
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
## 2015-06-02 NOTE — BHH Counselor (Signed)
Writer spoke with Pts sister Kurtis Bushman [phone]) in attempts to obtain collateral information. Sister stated "She my sister but I don't really know her like that I'm just her guardian". Sister also stated that Pt "lives over there where [facility]'ll at [facility]). Sister confirmed Pt is to go to a group home on 06/05/15 but, was unable to provide contact information "I don't know I just found one". Sister stated she would not be coming to the hospital "I don't live there" but, would "tell [place]". Sister did not provide information for "[place]".

## 2015-06-02 NOTE — ED Provider Notes (Signed)
[facility] Emergency Department Provider Note ____________________________________________  Time seen: Approximately 7:48 PM  I have reviewed the triage vital signs and the nursing notes.   HISTORY  Chief Complaint Left friend's house because she is having incontinence   HPI Theresa [name] is a 24 y.o. female who reports that her friend whom she is staying with became upset with her and made her leave the home that she was staying in because she has been incontinent for about the last 6 days. Theresa [name] denies being suicidal, having hallucinations or wanting to hurt herself or others. She states that she has been having some aching in her lower back and also had pain with urination, and has been incontinent for about the last 5 or 6 days. She denies any nausea or vomiting. No fevers or chills. No trouble breathing. No abdominal pain. She denies pregnancy.  She states that she is having burning when she goes to the bathroom, and feels like she is having a lot of trouble keeping her urine in.   Past Medical History  Diagnosis Date  . Anxiety   . Sickle cell anemia   . Schizoaffective disorder   . Mental retardation   . Asthma   . Speech and language deficits     Patient Active Problem List   Diagnosis Date Noted  . Schizophrenia 03/13/2015  . 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 Asthma 03/13/2015  . Bipolar disorder 01/26/2015    No past surgical history on file.  Current Outpatient Rx  Name  Route  Sig  Dispense  Refill  . busPIRone (BUSPAR) 10 MG tablet   Oral   Take 1 tablet (10 mg total) by mouth 3 (three) times daily.   21 tablet   0   . divalproex (DEPAKOTE ER) 500 MG 24 hr tablet   Oral   Take 2 tablets (1,000 mg total) by mouth at bedtime.   14 tablet   0   . haloperidol (HALDOL) 5 MG tablet   Oral   Take 5 mg by mouth daily.         Marland Kitchen ibuprofen (ADVIL,MOTRIN) 600 MG tablet   Oral   Take 600 mg by mouth 3  (three) times daily as needed for mild pain.         Marland Kitchen lithium carbonate (ESKALITH) 450 MG CR tablet   Oral   Take 1 tablet (450 mg total) by mouth at bedtime.   7 tablet   0   . lithium carbonate (LITHOBID) 300 MG CR tablet   Oral   Take 1 tablet (300 mg total) by mouth daily.   7 tablet   0   . medroxyPROGESTERone (DEPO-PROVERA) 150 MG/ML injection   Intramuscular   Inject 150 mg into the muscle every 3 (three) months.         . naproxen (NAPROSYN) 500 MG tablet   Oral   Take 500 mg by mouth 2 (two) times daily as needed for mild pain.         . promethazine (PHENERGAN) 25 MG tablet   Oral   Take 25 mg by mouth every 6 (six) hours as needed for nausea or vomiting.         Marland Kitchen QUEtiapine (SEROQUEL) 100 MG tablet   Oral   Take 1 tablet (100 mg total) by mouth at bedtime.   7 tablet   0   . QUEtiapine (SEROQUEL) 200 MG tablet   Oral   Take  200 mg by mouth 2 (two) times daily.         . QUEtiapine (SEROQUEL) 25 MG tablet   Oral   Take 1 tablet (25 mg total) by mouth 2 (two) times daily. Patient not taking: Reported on 03/13/2015   14 tablet   0     Allergies Review of patient's allergies indicates no known allergies.  No family history on file.  Social History Social History  Substance Use Topics  . Smoking status: Never Smoker   . Smokeless tobacco: Not on file  . Alcohol Use: No    Review of Systems Constitutional: No fever/chills Eyes: No visual changes. ENT: No sore throat. Cardiovascular: Denies chest pain. Respiratory: Denies shortness of breath. Gastrointestinal: No abdominal pain.  No nausea, no vomiting.  No diarrhea.  No constipation. Genitourinary: Burning with urination, incontinence. Musculoskeletal: Aching feeling in her lower back. Skin: Negative for rash. Neurological: Negative for headaches, focal weakness or numbness.  Denies hallucinations, desiring herself, or wanting to harm others.  She states she does not have  anywhere safe to go this evening because her friend kicked her out.  10-point ROS otherwise negative.  ____________________________________________   PHYSICAL EXAM:  VITAL SIGNS: ED Triage Vitals  Enc Vitals Group     BP 06/02/15 1820 111/83 mmHg     Pulse Rate 06/02/15 1820 102     Resp 06/02/15 1820 18     Temp 06/02/15 1820 98.8 F (37.1 C)     Temp Source 06/02/15 1820 Oral     SpO2 06/02/15 1820 97 %     Weight --      Height 06/02/15 1820  (1.676 m)     Head Cir --      Peak Flow --      Pain Score --      Pain Loc --      Pain Edu? --      Excl. in GC? --     Constitutional: Alert and oriented to self and place. Well appearing and in no acute distress. Eyes: Conjunctivae are normal. PERRL. EOMI. Head: Atraumatic. Nose: No congestion/rhinnorhea. Mouth/Throat: Mucous membranes are moist.  Oropharynx non-erythematous. Neck: No stridor.   Cardiovascular: Normal rate, regular rhythm. Grossly normal heart sounds.  Good peripheral circulation. Respiratory: Normal respiratory effort.  No retractions. Lungs CTAB. Gastrointestinal: Soft and nontender except for some mild discomfort over the suprapubic region without rebound or guarding. No distention. No abdominal bruits. No CVA tenderness. Musculoskeletal: No lower extremity tenderness nor edema.  No joint effusions. Neurologic:  Normal speech and language. No gross focal neurologic deficits are appreciated. No gait instability. Skin:  Skin is warm, dry and intact. No rash noted. 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  ____________________________________________   LABS (all labs ordered are listed, but only abnormal results are displayed)  Labs Reviewed  COMPREHENSIVE METABOLIC PANEL - Abnormal; Notable for the following:     Potassium 3.4 (*)    CO2 20 (*)    Total Protein 8.2 (*)    All other components within normal limits  ETHANOL - Abnormal; Notable for the following:    Alcohol, Ethyl (B) 6 (*)    All other components within normal limits  ACETAMINOPHEN LEVEL - Abnormal; Notable for the following:    Acetaminophen (Tylenol), Serum <10 (*)  All other components within normal limits  CBC - Abnormal; Notable for the following:    WBC 14.2 (*)    Hemoglobin 9.2 (*)    HCT 28.6 (*)    MCV 71.0 (*)    MCH 23.0 (*)    RDW 19.3 (*)    All other components within normal limits  URINALYSIS COMPLETEWITH MICROSCOPIC ([facility] ONLY) - Abnormal; Notable for the following:    Color, Urine YELLOW (*)    APPearance CLOUDY (*)    Hgb urine dipstick 2+ (*)    Protein, ur 30 (*)    Leukocytes, UA 3+ (*)    Bacteria, UA RARE (*)    Squamous Epithelial / LPF 0-5 (*)    All other components within normal limits  URINE CULTURE  SALICYLATE LEVEL  URINE DRUG SCREEN, QUALITATIVE ([facility] ONLY)  CBC  POC URINE PREG, ED  POCT PREGNANCY, URINE   ____________________________________________  EKG   ____________________________________________  RADIOLOGY   ____________________________________________   PROCEDURES  Procedure(s) performed: None  Critical Care performed: No  ____________________________________________   INITIAL IMPRESSION / ASSESSMENT AND PLAN / ED COURSE  Pertinent labs & imaging results that were available during my care of the patient were reviewed by me and considered in my medical decision making (see chart for details).  Patient presents after being brought by police after leaving her friend's home, with no work to go area she does have medical complaint of some incontinence, and her urinalysis reveals a urinary tract infection. She is a reassuring exam, she is afebrile but does have a slightly elevated white count. Because of the incontinence and dysuria, I am confident she has  urinary tract infection and we will initiate ceftriaxone as well as place her on twice daily Keflex. Because of her lack of living situation and not having a safe place to go this evening with consult to TTS as well as psychiatry for assistance with evaluation and placement. She will need to continue on Keflex for the next 10 days. Urine culture sent. She is not pregnant. No evidence or symptoms to suggest pyelonephritis at this time.  ----------------------------------------- 11:38 PM on 06/02/2015 -----------------------------------------  Patient resting comfortably at this time. We will continue to follow her overnight as we continue antibiotic treatment tract infection, have ordered a repeat CBC for the morning to check that her white count is down trending. Continue to await psychiatric consultation. TTS was able to contact patient's sister, but this was not able to secure a safe place for the patient to go home.  Ongoing care and disposition assigned Dr. Dolores Frame: Follow-up psychiatric consultation Ongoing treatment for urinary tract infection ____________________________________________   FINAL CLINICAL IMPRESSION(S) / ED DIAGNOSES  Final diagnoses:  Urinary tract infection, acute  Bipolar affective disorder, most recent episode unspecified type, remission status unspecified      Sharyn Creamer, MD 06/02/15 [id number]

## 2015-06-02 NOTE — ED Notes (Signed)
Pt reports walked away from friends house today walking down the road d/t wetting self, states "it just happened".  Pt denies any SI/HI or visual or auditory hallucinations at this time.  Pt c/o whole back pain for a couple of days, reports sleeping on the floor at friend's house.  States dysuria x2 days.  Pt is A&O, speaking in complete and coherent sentences, calm and cooperative and in NAD at this time.

## 2015-06-02 NOTE — ED Notes (Signed)
BEHAVIORAL HEALTH ROUNDING Patient sleeping: No. Patient alert and oriented: yes Behavior appropriate: Yes.  ;  Nutrition and fluids offered: Yes  Toileting and hygiene offered: Yes  Sitter present: yes Law enforcement present: Yes  

## 2015-06-02 NOTE — ED Notes (Signed)
BEHAVIORAL HEALTH ROUNDING Patient sleeping: Yes.   Patient alert and oriented: not applicable Behavior appropriate: Yes.   Nutrition and fluids offered: Yes  Toileting and hygiene offered: Yes  Sitter present: yes Law enforcement present: Yes  

## 2015-06-02 NOTE — ED Notes (Addendum)
Pt BIB police pt ran away from home. Pt has been urinating and defecating on her self and refusing to go to bathroom. Pt;s clothes are soiled with urine top and bottoms Denies HI pt has also been walking in traffic per officer.  Pt  States "I want to kill myself" Officer states the owner  where the patient was living stated that the pt can not go back to the house where she was living. Guardians name is Kurtis Bushman and her # [phone]

## 2015-06-02 NOTE — ED Notes (Signed)

## 2015-06-03 LAB — URINE DRUG SCREEN, QUALITATIVE (ARMC ONLY)
AMPHETAMINES, UR SCREEN: NOT DETECTED
BENZODIAZEPINE, UR SCRN: NOT DETECTED
Barbiturates, Ur Screen: NOT DETECTED
COCAINE METABOLITE, UR ~~LOC~~: NOT DETECTED
Cannabinoid 50 Ng, Ur ~~LOC~~: NOT DETECTED
MDMA (Ecstasy)Ur Screen: NOT DETECTED
Methadone Scn, Ur: NOT DETECTED
OPIATE, UR SCREEN: NOT DETECTED
PHENCYCLIDINE (PCP) UR S: NOT DETECTED
Tricyclic, Ur Screen: POSITIVE — AB

## 2015-06-03 LAB — CBC
HCT: 27.5 % — ABNORMAL LOW (ref 35.0–47.0)
HEMOGLOBIN: 9.1 g/dL — AB (ref 12.0–16.0)
MCH: 23.4 pg — AB (ref 26.0–34.0)
MCHC: 33 g/dL (ref 32.0–36.0)
MCV: 71 fL — ABNORMAL LOW (ref 80.0–100.0)
PLATELETS: 207 10*3/uL (ref 150–440)
RBC: 3.87 MIL/uL (ref 3.80–5.20)
RDW: 18.6 % — AB (ref 11.5–14.5)
WBC: 10.6 10*3/uL (ref 3.6–11.0)

## 2015-06-03 MED ORDER — RISPERIDONE 1 MG PO TBDP
2.0000 mg | ORAL_TABLET | Freq: Two times a day (BID) | ORAL | Status: DC
  Administered 2015-06-03 – 2015-06-10 (×14): 2 mg via ORAL
  Filled 2015-06-03 (×6): qty 1
  Filled 2015-06-03 (×3): qty 2
  Filled 2015-06-03: qty 1
  Filled 2015-06-03 (×2): qty 2
  Filled 2015-06-03 (×2): qty 1
  Filled 2015-06-03: qty 2
  Filled 2015-06-03 (×3): qty 1

## 2015-06-03 MED ORDER — LORAZEPAM 1 MG PO TABS
1.0000 mg | ORAL_TABLET | Freq: Once | ORAL | Status: AC
  Administered 2015-06-03: 1 mg via ORAL
  Filled 2015-06-03: qty 1

## 2015-06-03 NOTE — ED Notes (Signed)
.  [facility] 

## 2015-06-03 NOTE — ED Notes (Signed)
BEHAVIORAL HEALTH ROUNDING Patient sleeping: Yes.   Patient alert and oriented: sleeping Behavior appropriate: Yes.  ; If no, describe: sleeping Nutrition and fluids offered: sleeping Toileting and hygiene offered: sleeping Sitter present: no Law enforcement present: Yes  

## 2015-06-03 NOTE — ED Notes (Signed)

## 2015-06-03 NOTE — ED Notes (Signed)
BEHAVIORAL HEALTH ROUNDING Patient sleeping: Yes.   Patient alert and oriented: not applicable Behavior appropriate: Yes.  ; If no, describe:  Nutrition and fluids offered: Yes  Toileting and hygiene offered: Yes  Sitter present: yes Law enforcement present: Yes  ENVIRONMENTAL ASSESSMENT Potentially harmful objects out of patient reach: Yes.   Personal belongings secured: Yes.   Patient dressed in hospital provided attire only: Yes.   Plastic bags out of patient reach: Yes.   Patient care equipment (cords, cables, call bells, lines, and drains) shortened, removed, or accounted for: Yes.   Equipment and supplies removed from bottom of stretcher: Yes.   Potentially toxic materials out of patient reach: Yes.   Sharps container removed or out of patient reach: Yes.   

## 2015-06-03 NOTE — ED Notes (Signed)
Pt given lunch tray at this time along with soda, pt sitting up in bed at this time eating, tolerating well. No acute distress noted.

## 2015-06-03 NOTE — ED Notes (Signed)
MD Challa at bedside.

## 2015-06-03 NOTE — ED Notes (Signed)
Pt given dinner tray at this time, sitting up in bed eating and tolerating well. No acute distress noted.  

## 2015-06-03 NOTE — ED Notes (Signed)
BEHAVIORAL HEALTH ROUNDING Patient sleeping: Yes.   Patient alert and oriented: yes Behavior appropriate: Yes.  ;  Nutrition and fluids offered: Yes  Toileting and hygiene offered: Yes  Sitter present: yes Law enforcement present: Yes  

## 2015-06-03 NOTE — ED Notes (Signed)
Patient assigned to appropriate care area. Patient oriented to unit/care area: Informed that, for their safety, care areas are designed for safety and monitored by security cameras at all times; and visiting hours explained to patient. Patient verbalizes understanding, and verbal contract for safety obtained. 

## 2015-06-03 NOTE — ED Provider Notes (Signed)
-----------------------------------------   6:21 AM on 06/03/2015 -----------------------------------------   Blood pressure 115/62, pulse 77, temperature 98.6 F (37 C), temperature source Oral, resp. rate 20, height  (1.676 m), SpO2 99 %.  The patient had no acute events since last update.  Calm and cooperative at this time.  Disposition is pending per Psychiatry/Behavioral Medicine team recommendations.     Irean Hong, MD 06/03/15 [phone]

## 2015-06-03 NOTE — ED Notes (Signed)
Pt washed up in room, pt incontinent in bed, sheets changed and pt's clothing changed. Pt asking for orange juice.

## 2015-06-03 NOTE — ED Notes (Signed)
This tech received report from Beth V 

## 2015-06-03 NOTE — ED Notes (Signed)
BEHAVIORAL HEALTH ROUNDING Patient sleeping: No. Patient alert and oriented: yes Behavior appropriate: Yes.  ;  Nutrition and fluids offered: Yes  Toileting and hygiene offered: Yes  Sitter present: yes Law enforcement present: Yes  

## 2015-06-03 NOTE — ED Notes (Signed)
Pt given phone at this time to contact sister, pt was unable to reach sister at this time, pt states will try again later.

## 2015-06-03 NOTE — ED Notes (Addendum)
BEHAVIORAL HEALTH ROUNDING Patient sleeping: No. Patient alert and oriented: yes Behavior appropriate: Yes.  ; If no, describe:  Nutrition and fluids offered: Yes  Toileting and hygiene offered: Yes  Sitter present: yes Law enforcement present: Yes  

## 2015-06-03 NOTE — ED Notes (Signed)
BEHAVIORAL HEALTH ROUNDING Patient sleeping: No. Patient alert and oriented: yes Behavior appropriate: Yes.  ; If no, describe:  Nutrition and fluids offered: Yes  Toileting and hygiene offered: Yes  Sitter present: yes Law enforcement present: Yes  

## 2015-06-03 NOTE — ED Notes (Signed)
MD at bedside. 

## 2015-06-03 NOTE — ED Notes (Signed)
BEHAVIORAL HEALTH ROUNDING Patient sleeping: Yes.   Patient alert and oriented: yes Behavior appropriate: Yes.  ; If no, describe:  Nutrition and fluids offered: Yes  Toileting and hygiene offered: Yes  Sitter present: yes Law enforcement present: Yes  

## 2015-06-03 NOTE — ED Provider Notes (Signed)
-----------------------------------------   7:13 PM on 06/03/2015 -----------------------------------------  Dr. Guss Bunde just saw and evaluated patient and will admit to behavioral medicine for further evaluation and treatment.  Maurilio Lovely, MD 06/03/15 [phone]

## 2015-06-03 NOTE — ED Notes (Signed)
BEHAVIORAL HEALTH ROUNDING Patient sleeping: No. Patient alert and oriented: yes Behavior appropriate: Yes.  ; If no, describe:  Nutrition and fluids offered: Yes  Toileting and hygiene offered: Yes  Sitter present: no Law enforcement present: Yes  

## 2015-06-03 NOTE — Consult Note (Signed)
  Pt seen in [facility] ER. Pt is a 24 yr old AA female who is a poor historian.  Staff report that pt was brought by Conni Elliot and is here on Voluntary basis. 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 Staff report that the Group home does not want her back but they do not know the same of the Group home and has a guardian and Tel No is available. O Pt was seen laying in bed. Calm and quiet. Alert and knew her name and age and she is in ER. She did not know name of Hospital or the [facility] or [facility]. No agitation. Stares most of the time.  Does not appear to be responding to internal stimuli. Not able to answer most of the questions and she could not tell the name of the Group Home that she has been living. Behavior is unpredictable and is poor with poor impulse control. I/J impaired Imp SAD with psychosis. MR Mild with behavioral problems. REc Admit after pt is medically cleared and stable and bed is available

## 2015-06-03 NOTE — ED Notes (Signed)
Pt sleeping soundly; pt observed to be incontinent of urine-bed wet; easily awakened; cleaned well, clean scrubs given for pt to change; bed cleaned/clean linens in place;

## 2015-06-03 NOTE — ED Notes (Addendum)
BEHAVIORAL HEALTH ROUNDING Patient sleeping: Yes.   Patient alert and oriented: yes Behavior appropriate: Yes.  ;  Nutrition and fluids offered: Yes  Toileting and hygiene offered: Yes  Sitter present: yes Law enforcement present: Yes  

## 2015-06-04 NOTE — ED Notes (Signed)
BEHAVIORAL HEALTH ROUNDING Patient sleeping: Yes.   Patient alert and oriented: sleeping Behavior appropriate: Yes.  ; If no, describe:  Nutrition and fluids offered: sleeping Toileting and hygiene offered: Yes  Sitter present: yes Law enforcement present: Yes

## 2015-06-04 NOTE — Consult Note (Signed)
[facility] Consult   Reason for Consult:  Follow up Referring Physician:  ER Patient Identification: CIA GARRETSON MRN:  [id number] Principal Diagnosis: <principal problem not specified> Diagnosis:   Patient Active Problem List   Diagnosis Date Noted  . Schizophrenia [F20.9] 03/13/2015  . 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 Asthma [J45.909] 03/13/2015  . Bipolar disorder [F31.9] 01/26/2015    Total Time spent with patient: 45 minutes  Subjective:   Theresa [name] is a 24 y.o. 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 Pt was seen in [facility] today. Staff reports that she has been co-operative and complaint and eating well and resting well  And no negative behaviors noted. Marland Kitchen  HPI:  Long H/o MI and resides at [facility] and had behavioral problems for which she was brought to ER. HPI Elements:     Past Medical History:  Past Medical History  Diagnosis Date  . Anxiety   . Sickle cell anemia   . Schizoaffective disorder   . Mental retardation   . Asthma   . Speech and language deficits    No past surgical history on file. Family History: No family history on file. Social History:  History  Alcohol Use No     History  Drug Use No    Social History   Social History  . Marital Status: Single    Spouse Name: N/A  . Number of Children: N/A  . Years of Education: N/A   Social History Main Topics  . Smoking status: Never Smoker   . Smokeless tobacco: Not on file  . Alcohol Use: No  . Drug Use: No  . Sexual Activity: No   Other Topics Concern  . Not on file   Social History Narrative   Additional Social History:    Pain Medications: None Reported Prescriptions: None Reported Over the Counter: None Reported History of alcohol / drug use?: No history of alcohol / drug abuse Longest period of sobriety (when/how long): N/A                     Allergies:  No Known  Allergies  Labs:  Results for orders placed or performed during the hospital encounter of 06/02/15 (from the past 48 hour(s))  Comprehensive metabolic panel     Status: Abnormal   Collection Time: 06/02/15  6:27 PM  Result Value Ref Range   Sodium 139 135 - 145 mmol/L   Potassium 3.4 (L) 3.5 - 5.1 mmol/L   Chloride 110 101 - 111 mmol/L   CO2 20 (L) 22 - 32 mmol/L   Glucose, Bld 96 65 - 99 mg/dL   BUN 6 6 - 20 mg/dL   Creatinine, Ser 0.67 0.44 - 1.00 mg/dL   Calcium 9.3 8.9 - 10.3 mg/dL   Total Protein 8.2 (H) 6.5 - 8.1 g/dL   Albumin 3.9 3.5 - 5.0 g/dL   AST 20 15 - 41 U/L   ALT 15 14 - 54 U/L   Alkaline Phosphatase 73 38 - 126 U/L   Total Bilirubin 0.6 0.3 - 1.2 mg/dL   GFR calc non Af Amer >60 >60 mL/min   GFR calc Af Amer >60 >60 mL/min    Comment: (NOTE) The eGFR has been calculated using the CKD EPI equation. This calculation has not been validated in all clinical situations. eGFR's persistently <60 mL/min signify possible Chronic Kidney Disease.    Anion  gap 9 5 - 15  Ethanol (ETOH)     Status: Abnormal   Collection Time: 06/02/15  6:27 PM  Result Value Ref Range   Alcohol, Ethyl (B) 6 (H) <5 mg/dL    Comment:        LOWEST DETECTABLE LIMIT FOR SERUM ALCOHOL IS 5 mg/dL FOR MEDICAL PURPOSES ONLY   Salicylate level     Status: None   Collection Time: 06/02/15  6:27 PM  Result Value Ref Range   Salicylate Lvl <2.8 2.8 - 30.0 mg/dL  Acetaminophen level     Status: Abnormal   Collection Time: 06/02/15  6:27 PM  Result Value Ref Range   Acetaminophen (Tylenol), Serum <10 (L) 10 - 30 ug/mL    Comment:        THERAPEUTIC CONCENTRATIONS VARY SIGNIFICANTLY. A RANGE OF 10-30 ug/mL MAY BE AN EFFECTIVE CONCENTRATION FOR MANY PATIENTS. HOWEVER, SOME ARE BEST TREATED AT CONCENTRATIONS OUTSIDE THIS RANGE. ACETAMINOPHEN CONCENTRATIONS >150 ug/mL AT 4 HOURS AFTER INGESTION AND >50 ug/mL AT 12 HOURS AFTER INGESTION ARE OFTEN ASSOCIATED WITH TOXIC REACTIONS.   CBC      Status: Abnormal   Collection Time: 06/02/15  6:27 PM  Result Value Ref Range   WBC 14.2 (H) 3.6 - 11.0 K/uL   RBC 4.02 3.80 - 5.20 MIL/uL   Hemoglobin 9.2 (L) 12.0 - 16.0 g/dL   HCT 28.6 (L) 35.0 - 47.0 %   MCV 71.0 (L) 80.0 - 100.0 fL   MCH 23.0 (L) 26.0 - 34.0 pg   MCHC 32.4 32.0 - 36.0 g/dL   RDW 19.3 (H) 11.5 - 14.5 %   Platelets 222 150 - 440 K/uL  Urine Drug Screen, Qualitative ([facility] only)     Status: Abnormal   Collection Time: 06/02/15  7:09 PM  Result Value Ref Range   Tricyclic, Ur Screen POSITIVE (A) NONE DETECTED   Amphetamines, Ur Screen NONE DETECTED NONE DETECTED   MDMA (Ecstasy)Ur Screen NONE DETECTED NONE DETECTED   Cocaine Metabolite,Ur [place] NONE DETECTED NONE DETECTED   Opiate, Ur Screen NONE DETECTED NONE DETECTED   Phencyclidine (PCP) Ur S NONE DETECTED NONE DETECTED   Cannabinoid 50 Ng, Ur  NONE DETECTED NONE DETECTED   Barbiturates, Ur Screen NONE DETECTED NONE DETECTED   Benzodiazepine, Ur Scrn NONE DETECTED NONE DETECTED   Methadone Scn, Ur NONE DETECTED NONE DETECTED    Comment: (NOTE) 315  Tricyclics, urine               Cutoff 1000 ng/mL 200  Amphetamines, urine             Cutoff 1000 ng/mL 300  MDMA (Ecstasy), urine           Cutoff 500 ng/mL 400  Cocaine Metabolite, urine       Cutoff 300 ng/mL 500  Opiate, urine                   Cutoff 300 ng/mL 600  Phencyclidine (PCP), urine      Cutoff 25 ng/mL 700  Cannabinoid, urine              Cutoff 50 ng/mL 800  Barbiturates, urine             Cutoff 200 ng/mL 900  Benzodiazepine, urine           Cutoff 200 ng/mL 1000 Methadone, urine                Cutoff 300 ng/mL 1100 1200  The urine drug screen provides only a preliminary, unconfirmed 1300 analytical test result and should not be used for non-medical 1400 purposes. Clinical consideration and professional judgment should 1500 be applied to any positive drug screen result due to possible 1600 interfering substances. A more specific alternate  chemical method 1700 must be used in order to obtain a confirmed analytical result.  1800 Gas chromato graphy / mass spectrometry (GC/MS) is the preferred 1900 confirmatory method.   Urinalysis complete, with microscopic ([facility] only)     Status: Abnormal   Collection Time: 06/02/15  7:09 PM  Result Value Ref Range   Color, Urine YELLOW (A) YELLOW   APPearance CLOUDY (A) CLEAR   Glucose, UA NEGATIVE NEGATIVE mg/dL   Bilirubin Urine NEGATIVE NEGATIVE   Ketones, ur NEGATIVE NEGATIVE mg/dL   Specific Gravity, Urine 1.005 1.005 - 1.030   Hgb urine dipstick 2+ (A) NEGATIVE   pH 6.0 5.0 - 8.0   Protein, ur 30 (A) NEGATIVE mg/dL   Nitrite NEGATIVE NEGATIVE   Leukocytes, UA 3+ (A) NEGATIVE   RBC / HPF 0-5 0 - 5 RBC/hpf   WBC, UA TOO NUMEROUS TO COUNT 0 - 5 WBC/hpf   Bacteria, UA RARE (A) NONE SEEN   Squamous Epithelial / LPF 0-5 (A) NONE SEEN   WBC Clumps PRESENT    Mucous PRESENT   Urine culture     Status: None (Preliminary result)   Collection Time: 06/02/15  7:09 PM  Result Value Ref Range   Specimen Description URINE, CLEAN CATCH    Special Requests Normal    Culture      >=100,000 COLONIES/mL GRAM NEGATIVE RODS IDENTIFICATION AND SUSCEPTIBILITIES TO FOLLOW    Report Status PENDING   Pregnancy, urine POC     Status: None   Collection Time: 06/02/15  7:49 PM  Result Value Ref Range   Preg Test, Ur NEGATIVE NEGATIVE    Comment:        THE SENSITIVITY OF THIS METHODOLOGY IS >24 mIU/mL   CBC     Status: Abnormal   Collection Time: 06/03/15  6:46 AM  Result Value Ref Range   WBC 10.6 3.6 - 11.0 K/uL   RBC 3.87 3.80 - 5.20 MIL/uL   Hemoglobin 9.1 (L) 12.0 - 16.0 g/dL   HCT 27.5 (L) 35.0 - 47.0 %   MCV 71.0 (L) 80.0 - 100.0 fL   MCH 23.4 (L) 26.0 - 34.0 pg   MCHC 33.0 32.0 - 36.0 g/dL   RDW 18.6 (H) 11.5 - 14.5 %   Platelets 207 150 - 440 K/uL    Vitals: Blood pressure 104/67, pulse 80, temperature 98.6 F (37 C), temperature source Oral, resp. rate 18, height 5' 6"   (1.676 m), SpO2 100 %.  Risk to Self: Suicidal Ideation: No Suicidal Intent: No Is patient at risk for suicide?: No Suicidal Plan?: No Access to Means: No What has been your use of drugs/alcohol within the last 12 months?: Pt. Denies drug/alcohol use Other Self Harm Risks: None Reported Intentional Self Injurious Behavior: None Risk to Others: Homicidal Ideation: No Thoughts of Harm to Others: No Comment - Thoughts of Harm to Others: N/A Current Homicidal Intent: No Current Homicidal Plan: No Access to Homicidal Means: No Describe Access to Homicidal Means: N/A Identified Victim: N/A History of harm to others?: No Assessment of Violence: None Noted Violent Behavior Description: N/A Does patient have access to weapons?: No Criminal Charges Pending?: Yes Describe Pending Criminal Charges: "I don't want to tell  you Does patient have a court date: Yes Court Date:  ("I don't know") Prior Inpatient Therapy: Prior Inpatient Therapy: Yes Prior Therapy Dates: "I don't know" Prior Therapy Facilty/Provider(s): Unkown Reason for Treatment: "I don't know" Prior Outpatient Therapy: Prior Outpatient Therapy: No (Pt Denies) Prior Therapy Dates: N/A Prior Therapy Facilty/Provider(s): Pt Denies Prior Therapy Reason for Treatment: N/A Does patient have an ACCT team?: No Does patient have Intensive In-House Services?  : No Does patient have [facility] services? : No Does patient have P4CC services?: No  Current Facility-Administered Medications  Medication Dose Route Frequency Provider Last Rate Last Dose  . cephALEXin (KEFLEX) capsule 500 mg  500 mg Oral Q12H Delman Kitten, MD   500 mg at 06/04/15 0936  . risperiDONE (RISPERDAL M-TABS) disintegrating tablet 2 mg  2 mg Oral BID Dewain Penning, MD   2 mg at 06/04/15 5409   Current Outpatient Prescriptions  Medication Sig Dispense Refill  . busPIRone (BUSPAR) 10 MG tablet Take 1 tablet (10 mg total) by mouth 3 (three) times daily. 21 tablet 0  .  divalproex (DEPAKOTE ER) 500 MG 24 hr tablet Take 2 tablets (1,000 mg total) by mouth at bedtime. 14 tablet 0  . haloperidol (HALDOL) 5 MG tablet Take 5 mg by mouth daily.    Marland Kitchen ibuprofen (ADVIL,MOTRIN) 600 MG tablet Take 600 mg by mouth 3 (three) times daily as needed for mild pain.    Marland Kitchen lithium carbonate (ESKALITH) 450 MG CR tablet Take 1 tablet (450 mg total) by mouth at bedtime. 7 tablet 0  . lithium carbonate (LITHOBID) 300 MG CR tablet Take 1 tablet (300 mg total) by mouth daily. 7 tablet 0  . medroxyPROGESTERone (DEPO-PROVERA) 150 MG/ML injection Inject 150 mg into the muscle every 3 (three) months.    . naproxen (NAPROSYN) 500 MG tablet Take 500 mg by mouth 2 (two) times daily as needed for mild pain.    . promethazine (PHENERGAN) 25 MG tablet Take 25 mg by mouth every 6 (six) hours as needed for nausea or vomiting.    Marland Kitchen QUEtiapine (SEROQUEL) 100 MG tablet Take 1 tablet (100 mg total) by mouth at bedtime. 7 tablet 0  . QUEtiapine (SEROQUEL) 200 MG tablet Take 200 mg by mouth 2 (two) times daily.    . QUEtiapine (SEROQUEL) 25 MG tablet Take 1 tablet (25 mg total) by mouth 2 (two) times daily. (Patient not taking: Reported on 03/13/2015) 14 tablet 0    Musculoskeletal: Strength & Muscle Tone: within normal limits Gait & Station: normal Patient leans: N/A  Psychiatric Specialty Exam: Physical Exam  Nursing note and vitals reviewed.   ROS  Blood pressure 104/67, pulse 80, temperature 98.6 F (37 C), temperature source Oral, resp. rate 18, height 5' 6"  (1.676 m), SpO2 100 %.There is no weight on file to calculate BMI.  General Appearance: Disheveled  Eye Sport and exercise psychologist::  Fair  Speech:  Slurred  Volume:  Normal  Mood:  Negative  Affect:  Negative  Thought Process:  NA  Orientation:  Other:  Knew she was in a Hospital and it was August of 2016 because of her Mild MR.  Thought Content:  Negative  Suicidal Thoughts:  No  Homicidal Thoughts:  No  Memory:  Immediate;   Poor Recent;    Poor Remote;   Fair adequate  Judgement:  Fair  Insight:  Fair  Psychomotor Activity:  Normal  Concentration:  Fair  Recall:  Poor  Fund of Knowledge:Poor  Language: Fair  Akathisia:  No  Handed:  Right  AIMS (if indicated):     Assets:  Communication Skills Desire for Improvement Housing Transportation Others:  adequate  ADL's:  Intact  Cognition: Impaired,  Mild  Sleep:      Medical Decision Making: Established Problem, Stable/Improving (1)  Treatment Plan Summary: Plan Continue current meds. Discharge pt to [facility] as they have agreed to take her back on 06/05/2015  Plan:  No evidence of imminent risk to self or others at present.   Disposition: as above  Dewain Penning 06/04/2015 4:32 PM

## 2015-06-04 NOTE — ED Notes (Signed)
BEHAVIORAL HEALTH ROUNDING Patient sleeping: Yes.   Patient alert and oriented: yes Behavior appropriate: Yes.  ; If no, describe:  Nutrition and fluids offered: Yes  Toileting and hygiene offered: Yes  Sitter present: not applicable Law enforcement present: Yes  

## 2015-06-04 NOTE — BHH Counselor (Addendum)
Discussed pt. with ER MD (Dr. Huel Cote) and , pt. is able to d/c to group home on 06/05/15 when medically cleared per Dr. Guss Bunde.

## 2015-06-04 NOTE — ED Notes (Signed)

## 2015-06-04 NOTE — ED Notes (Signed)
BEHAVIORAL HEALTH ROUNDING Patient sleeping: Yes.   Patient alert and oriented: yes Behavior appropriate: Yes.  ; If no, describe:  Nutrition and fluids offered: Yes  Toileting and hygiene offered: Yes  Sitter present: yes Law enforcement present: Yes  

## 2015-06-04 NOTE — ED Notes (Signed)
Patient assigned to appropriate care area. Patient oriented to unit/care area: Informed that, for their safety, care areas are designed for safety and monitored by staff at all times; and visiting hours explained to patient. Patient verbalizes understanding, and verbal contract for safety obtained. 

## 2015-06-04 NOTE — ED Notes (Signed)
BEHAVIORAL HEALTH ROUNDING Patient sleeping: No. Patient alert and oriented: yes Behavior appropriate: Yes.  ; If no, describe:  Nutrition and fluids offered: Yes  Toileting and hygiene offered: Yes  Sitter present: not applicable Law enforcement present: Yes  

## 2015-06-04 NOTE — ED Notes (Signed)
BEHAVIORAL HEALTH ROUNDING Patient sleeping: Yes.   Patient alert and oriented: not applicable Behavior appropriate: Yes.  ; If no, describe:  Nutrition and fluids offered: sleeping Toileting and hygiene offered: Yes  Sitter present: yes Law enforcement present: Yes

## 2015-06-04 NOTE — ED Notes (Signed)
Per Dr. Guss Bunde, patient can be discharged to group home tomorrow morning.   Group Home: [facility]  Group Home Administrator: Sloan Leiter

## 2015-06-04 NOTE — ED Notes (Signed)
BEHAVIORAL HEALTH ROUNDING Patient sleeping: Yes.   Patient alert and oriented: sleeping Behavior appropriate: Yes.  ; If no, describe: sleeping Nutrition and fluids offered: sleeping Toileting and hygiene offered: sleeping Sitter present: yes Law enforcement present: Yes  

## 2015-06-04 NOTE — ED Provider Notes (Signed)
-----------------------------------------   6:42 AM on 06/04/2015 -----------------------------------------   Blood pressure 98/45, pulse 91, temperature 98.7 F (37.1 C), temperature source Oral, resp. rate 19, height  (1.676 m), SpO2 100 %.  The patient had no acute events since last update.  Calm and cooperative at this time.  Disposition is pending per Psychiatry/Behavioral Medicine team recommendations.     Irean Hong, MD 06/04/15 [phone]

## 2015-06-04 NOTE — ED Notes (Signed)
BEHAVIORAL HEALTH ROUNDING Patient sleeping: Yes.   Patient alert and oriented: not applicable Behavior appropriate: Yes.  ; If no, describe:  Nutrition and fluids offered: Yes  Toileting and hygiene offered: Yes  Sitter present: not applicable Law enforcement present: Yes  

## 2015-06-05 LAB — URINE CULTURE
Culture: >=100000
Special Requests: NORMAL

## 2015-06-05 MED ORDER — ONDANSETRON 4 MG PO TBDP
ORAL_TABLET | ORAL | Status: AC
  Administered 2015-06-05: 4 mg via ORAL
  Filled 2015-06-05: qty 1

## 2015-06-05 MED ORDER — LORAZEPAM 1 MG PO TABS
1.0000 mg | ORAL_TABLET | Freq: Once | ORAL | Status: AC
  Administered 2015-06-05: 1 mg via ORAL
  Filled 2015-06-05: qty 1

## 2015-06-05 MED ORDER — ONDANSETRON 4 MG PO TBDP
4.0000 mg | ORAL_TABLET | Freq: Once | ORAL | Status: AC
  Administered 2015-06-05: 4 mg via ORAL

## 2015-06-05 MED ORDER — CEPHALEXIN 500 MG PO CAPS: 500.0000 mg | ORAL_CAPSULE | Freq: Two times a day (BID) | ORAL | Status: DC

## 2015-06-05 MED ORDER — LORAZEPAM 2 MG PO TABS
2.0000 mg | ORAL_TABLET | Freq: Four times a day (QID) | ORAL | Status: DC | PRN
  Administered 2015-06-05 – 2015-06-10 (×5): 2 mg via ORAL
  Filled 2015-06-05 (×4): qty 1
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
## 2015-06-05 NOTE — ED Notes (Signed)
ED BHU PLACEMENT JUSTIFICATION Is the patient under IVC or is there intent for IVC: No. Is the patient medically cleared: Yes.   Is there vacancy in the ED BHU: No. Is the population mix appropriate for patient: No. Is the patient awaiting placement in inpatient or outpatient setting: No. Has the patient had a psychiatric consult: Yes.   Survey of unit performed for contraband, proper placement and condition of furniture, tampering with fixtures in bathroom, shower, and each patient room: Yes.  ; Findings:  APPEARANCE/BEHAVIOR calm and cooperative NEURO ASSESSMENT Orientation: place and person Hallucinations: No.None noted (Hallucinations) Speech: Normal Gait: normal RESPIRATORY ASSESSMENT Normal expansion.  Clear to auscultation.  No rales, rhonchi, or wheezing. CARDIOVASCULAR ASSESSMENT regular rate and rhythm, S1, S2 normal, no murmur, click, rub or gallop GASTROINTESTINAL ASSESSMENT soft, nontender, BS WNL, no r/g EXTREMITIES normal strength, tone, and muscle mass PLAN OF CARE Provide calm/safe environment. Vital signs assessed twice daily. ED BHU Assessment once each 12-hour shift. Collaborate with intake RN daily or as condition indicates. Assure the ED provider has rounded once each shift. Provide and encourage hygiene. Provide redirection as needed. Assess for escalating behavior; address immediately and inform ED provider.  Assess family dynamic and appropriateness for visitation as needed: Yes.  ; If necessary, describe findings:  Educate the patient/family about BHU procedures/visitation: Yes.  ; If necessary, describe findings:

## 2015-06-05 NOTE — ED Notes (Signed)
BEHAVIORAL HEALTH ROUNDING Patient sleeping: No. Patient alert and oriented: yes Behavior appropriate: No.; If no, describe: Pt pacing back and forth around bed. Yelling at staff about going home. Nutrition and fluids offered: Yes  Toileting and hygiene offered: Yes  Sitter present: yes Law enforcement present: Yes ODS

## 2015-06-05 NOTE — ED Notes (Addendum)
Per Sloan Leiter, pt is no longer coming to to [facility] group home, Ms. Lambert Keto states she spoke [employer]. from [facility] DSS stating DSS would be taking over placement for the patient. [phone]
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
## 2015-06-05 NOTE — ED Notes (Signed)
BEHAVIORAL HEALTH ROUNDING Patient sleeping: Yes.   Patient alert and oriented: not applicable Behavior appropriate: Yes.  ; If no, describe:  Nutrition and fluids offered: Yes  Toileting and hygiene offered: Yes  Sitter present: yes Law enforcement present: Yes ODS 

## 2015-06-05 NOTE — ED Notes (Signed)
BEHAVIORAL HEALTH ROUNDING Patient sleeping: No. Patient alert and oriented: yes Behavior appropriate: Yes.  ; If no, describe:  Nutrition and fluids offered: Yes  Toileting and hygiene offered: Yes  Sitter present: no Law enforcement present: Yes  

## 2015-06-05 NOTE — ED Notes (Signed)
BEHAVIORAL HEALTH ROUNDING  Patient sleeping: No   Patient alert and oriented: Yes Behavior appropriate: Yes. ; If no, describe:  Nutrition and fluids offered: Yes  Toileting and hygiene offered: Yes  Sitter present: Not applicable  Law enforcement present: Yes

## 2015-06-05 NOTE — ED Notes (Addendum)
Pt becoming increasingly agitated, yelling out "I don't want to be here, I want to go home." Pt makes no attempt to leave. Pt is unable to go home d/t losing her placement at the group home. Pt requesting to speak with sister. Agitated movements noted, pt pacing around her bed, pt repeatedly coming out of room. Voluntarily took Ativan tablet.

## 2015-06-05 NOTE — ED Notes (Signed)
BEHAVIORAL HEALTH ROUNDING Patient sleeping: Yes.   Patient alert and oriented: not applicable Behavior appropriate: Yes.  ; If no, describe:  Nutrition and fluids offered: Yes  Toileting and hygiene offered: Yes  Sitter present: not applicable Law enforcement present: Yes  

## 2015-06-05 NOTE — ED Notes (Signed)
BEHAVIORAL HEALTH ROUNDING Patient sleeping: No. Patient alert and oriented: yes Behavior appropriate: Yes.  ; If no, describe:  Nutrition and fluids offered: Yes Toileting and hygiene offered: Yes  Sitter present: yes Law enforcement present: Yes ODS  

## 2015-06-05 NOTE — ED Notes (Signed)
BEHAVIORAL HEALTH ROUNDING Patient sleeping: Yes.   Patient alert and oriented: sleeping Behavior appropriate: Yes.  ; If no, describe: sleeping Nutrition and fluids offered: sleeping Toileting and hygiene offered: sleeping Sitter present: no Law enforcement present: Yes  and ODS 

## 2015-06-05 NOTE — ED Notes (Signed)
BEHAVIORAL HEALTH ROUNDING Patient sleeping: No. Patient alert and oriented: yes Behavior appropriate: No.; If no, describe: Pt repeatedly exits the room and states "I want to go home".  Nutrition and fluids offered: Yes  Toileting and hygiene offered: Yes  Sitter present: yes Law enforcement present: Yes ODS

## 2015-06-05 NOTE — ED Notes (Signed)
BEHAVIORAL HEALTH ROUNDING  Patient sleeping: Yes   Patient alert and oriented: Sleeping  Behavior appropriate: Yes. ; If no, describe:  Nutrition and fluids offered: Yes  Toileting and hygiene offered: Yes  Sitter present: Not applicable  Law enforcement present: Yes   

## 2015-06-05 NOTE — ED Notes (Signed)
Soda and chips provided for pt. No distress noted at this time. Pt alert and appears calm.

## 2015-06-05 NOTE — ED Notes (Signed)
Pt began vomiting while she was in the bed. Pt given  of ODT Zofran. No blood noted in vomit at this time. Pt had 3 episodes of vomiting.
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
## 2015-06-05 NOTE — Progress Notes (Signed)
DSS Worker Diona Fanti and LCSW  Attempted to meet with patient to discuss that she will need a new placement and is not able to go to the group home. Patient was in conflict with ED staff and reactionary and emotion outburst prevented [place] to interview and meet with the patient. We will reconnect tomorrow.

## 2015-06-05 NOTE — ED Notes (Signed)
Pt becoming increasingly agitated, repeatedly walking into hall. Pt yelling at staff stating "I want to go home". Pt able to be redirected. Pt lays on bed crying.

## 2015-06-05 NOTE — ED Provider Notes (Addendum)
-----------------------------------------   11:13 AM on 06/05/2015 -----------------------------------------   Blood pressure 110/78, pulse 102, temperature 98.3 F (36.8 C), temperature source Oral, resp. rate 20, height  (1.676 m), SpO2 100 %.  The patient had no acute events since last update.  Calm and cooperative at this time.  Disposition is pending per Psychiatry/Behavioral Medicine team recommendations.    ----------------------------------------- 12:35 PM on 06/05/2015 -----------------------------------------  Patient to be discharged back to her group home today. Vital signs stable. She is afebrile. Discharge with Keflex and PCP follow-up.  ----------------------------------------- 1:34 PM on 06/05/2015 -----------------------------------------  Received word that the patient's group home will no longer accept her back. DSS is taking over placement for the patient. She will remain in the emergency department until appropriate placement can be arranged.   Gayla Doss, MD 06/05/15 1113  Gayla Doss, MD 06/05/15 8657  Gayla Doss, MD 06/05/15 [phone]

## 2015-06-05 NOTE — ED Notes (Signed)

## 2015-06-06 MED ORDER — ZIPRASIDONE MESYLATE 20 MG IM SOLR
INTRAMUSCULAR | Status: AC
  Filled 2015-06-06: qty 20

## 2015-06-06 NOTE — ED Notes (Signed)
BEHAVIORAL HEALTH ROUNDING Patient sleeping: No. Patient alert and oriented: yes Behavior appropriate: Yes.  ; If no, describe:  Nutrition and fluids offered: Yes  Toileting and hygiene offered: Yes  Sitter present: no Law enforcement present: Yes  

## 2015-06-06 NOTE — ED Notes (Signed)
BEHAVIORAL HEALTH ROUNDING Patient sleeping: Yes.   Patient alert and oriented: not applicable Behavior appropriate: Yes.  ; If no, describe:  Nutrition and fluids offered: No Toileting and hygiene offered: No Sitter present: not applicable Law enforcement present: Yes  

## 2015-06-06 NOTE — Progress Notes (Signed)
LCSW in observation was unable to engage with patient due to her being verbally aggressive to all staff. Patient was medicated and is now resting. LCSW will begin calling for future placement.  LCSW will complete her FL2 and Fax it to [facility] Nadine Counts  LCSW called and followed up with Bob-[facility] at DSS

## 2015-06-06 NOTE — ED Notes (Signed)
Pt moved to hallway so facilities personnel can work on equipment.

## 2015-06-06 NOTE — ED Notes (Signed)
BEHAVIORAL HEALTH ROUNDING Patient sleeping: Yes.   Patient alert and oriented: sleeping Behavior appropriate: Yes.  ; If no, describe: sleeping Nutrition and fluids offered: sleeping Toileting and hygiene offered: sleeping Sitter present: no Law enforcement present: Yes  and ODS 

## 2015-06-06 NOTE — ED Notes (Signed)
BEHAVIORAL HEALTH ROUNDING Patient sleeping: No. Patient alert and oriented: alert; not oriented Behavior appropriate: Yes.  ; If no, describe:  Nutrition and fluids offered: Yes  Toileting and hygiene offered: Yes  Sitter present: not applicable Law enforcement present: Yes  

## 2015-06-06 NOTE — ED Notes (Signed)
Left voice message for pt's sister. Pt informed that message left.

## 2015-06-06 NOTE — ED Notes (Signed)

## 2015-06-06 NOTE — Progress Notes (Deleted)
LCSW in observation was unable to engage with patient due to her being verbally aggressive to all staff. Patient was medicated and is now resting. LCSW will begin calling for future placement.  LCSW will complete her FL2 and Fax it to [facility]  LCSW called and followed up with [facility] and will await scheduled tests/exams  LCSW met with Dr Gayle and she will order a TB test and physio-therapy exam was requested. 

## 2015-06-06 NOTE — BHH Counselor (Signed)
Writer spoke to [employer]. They are putting in an application for Care Coordination for Pt.

## 2015-06-06 NOTE — ED Notes (Signed)
Patient allowed to try calling her sister by herself and she calmed down and went into her room.

## 2015-06-06 NOTE — ED Notes (Signed)
BEHAVIORAL HEALTH ROUNDING Patient sleeping: No. Patient alert and oriented: yes Behavior appropriate: No.; If no, describe: pt yelling about calling group home and sister and wanting to go home Nutrition and fluids offered: Yes  Toileting and hygiene offered: Yes  Sitter present: no Law enforcement present: Yes

## 2015-06-06 NOTE — ED Notes (Signed)
Pt back in room.

## 2015-06-06 NOTE — Progress Notes (Signed)
LCSW checked on patient and she is resting now according to  ED staff. This worker will attempt to engage in early afternoon.

## 2015-06-06 NOTE — ED Notes (Signed)
Pt acting out - screaming in hallway and demanding to leave. Pt informed that we are working on finding placement for her and that we will keep her posted. Pt also informed that we are giving her some medications that will help her to calm down and would like for her to remain calm so we will not have to give her an injection. Pt given medications, and is currently lying on her bed. Pt refused to allow scanning of armband for medications.

## 2015-06-06 NOTE — ED Notes (Signed)
BEHAVIORAL HEALTH ROUNDING Patient sleeping: No. Patient alert and oriented: yes Behavior appropriate: No.; If no, describe:  Nutrition and fluids offered: Yes  Toileting and hygiene offered: Yes  Sitter present: no Law enforcement present: Yes  

## 2015-06-06 NOTE — ED Notes (Signed)
BEHAVIORAL HEALTH ROUNDING Patient sleeping: Yes.   Patient alert and oriented: not applicable Behavior appropriate: Yes.  ; If no, describe:  Nutrition and fluids offered: Yes  and No Toileting and hygiene offered: No Sitter present: no Law enforcement present: Yes  

## 2015-06-06 NOTE — ED Provider Notes (Signed)
-----------------------------------------   9:49 AM on 06/06/2015 -----------------------------------------  Patient is escalating, yelling/screaming outside of the room, refusing to enter her room, looming threateningly over staff intermittently is able to de-escalate with much coaxing. We'll give her morning Risperdal, 2 mg of by mouth Ativan. I have placed IVC secondary to increased aggression, agitation and my concern that she is becoming a threat to personnel. Will reassess for need for additional meds/chemical restraints.  Gayla Doss, MD 06/06/15 [phone]

## 2015-06-06 NOTE — ED Notes (Signed)
BEHAVIORAL HEALTH ROUNDING Patient sleeping: Yes.   Patient alert and oriented: not applicable Behavior appropriate: Yes.  ; If no, describe:  Nutrition and fluids offered: No Toileting and hygiene offered: No Sitter present: no Law enforcement present: Yes  

## 2015-06-06 NOTE — ED Notes (Signed)
Pt given ice cream - no spoon.  

## 2015-06-06 NOTE — ED Notes (Signed)
Pt is up and down, in and out of room, asking to use telephone. Pt advised that telephone time begins at [facility] and that she will need to stay in her room until then.

## 2015-06-06 NOTE — ED Notes (Signed)
BEHAVIORAL HEALTH ROUNDING Patient sleeping: Yes.   Patient alert and oriented: na Behavior appropriate: Yes.  ; If no, describe:  Nutrition and fluids offered: Yes  Toileting and hygiene offered: Yes  Sitter present: no Law enforcement present: Yes

## 2015-06-06 NOTE — ED Provider Notes (Signed)
-----------------------------------------   2:12 AM on 06/06/2015 -----------------------------------------  No acute events. Labs are consistent with urinary tract infection, patient currently on anabiotic treatment. Patient has been seen by Dr. Guss Bunde, and she believes the patient is improving and will likely be able to go back to her group home. We will continue to monitor the patient until appropriate placement can be made.  Minna Antis, MD 06/06/15 [phone]

## 2015-06-06 NOTE — ED Notes (Signed)
BEHAVIORAL HEALTH ROUNDING Patient sleeping: No. Patient alert and oriented: yes Behavior appropriate: No.; If no, describe: yelling; wants to call group home/sister Nutrition and fluids offered: Yes  Toileting and hygiene offered: Yes  Sitter present: no Law enforcement present: Yes

## 2015-06-07 MED ORDER — RISPERIDONE 1 MG PO TABS
ORAL_TABLET | ORAL | Status: AC
  Filled 2015-06-07: qty 2

## 2015-06-07 NOTE — ED Notes (Signed)
Pt. transfered to BHU without incident after report from. Placed in room and oriented to unit. Pt. informed that for their safety all care areas are designed for safety and monitored by security cameras at all times; and visiting hours explained to patient. Patient verbalizes understanding, and verbal contract for safety obtained.   

## 2015-06-07 NOTE — ED Notes (Signed)
Patient is resting comfortably. 

## 2015-06-07 NOTE — ED Notes (Signed)

## 2015-06-07 NOTE — ED Notes (Signed)
BEHAVIORAL HEALTH ROUNDING Patient sleeping: Yes.   Patient alert and oriented: not applicable Behavior appropriate: Yes.  ; If no, describe:  Nutrition and fluids offered: No Toileting and hygiene offered: No Sitter present: not applicable Law enforcement present: Yes  

## 2015-06-07 NOTE — ED Notes (Signed)

## 2015-06-07 NOTE — ED Notes (Signed)
Dr. Fanny Bien in pt room to visualize wound on R arm.  MD states wound is a small skin tear and no intervention needed at this time.  Pt states wound occurred prior to arrival in ED at her friend's house.  Pt had band-aid to arm prior to this time.

## 2015-06-07 NOTE — ED Notes (Signed)
BEHAVIORAL HEALTH ROUNDING Patient sleeping: No. Patient alert and oriented: yes Behavior appropriate: Yes.  ; If no, describe:  Nutrition and fluids offered: Yes  Toileting and hygiene offered: Yes  Sitter present: yes Law enforcement present: Yes  

## 2015-06-07 NOTE — ED Notes (Signed)
ED BHU PLACEMENT JUSTIFICATION Is the patient under IVC or is there intent for IVC: Yes.   Is the patient medically cleared: Yes.   Is there vacancy in the ED BHU: Yes.   Is the population mix appropriate for patient: Yes.   Is the patient awaiting placement in inpatient or outpatient setting: Yes.   Has the patient had a psychiatric consult: Yes.   Survey of unit performed for contraband, proper placement and condition of furniture, tampering with fixtures in bathroom, shower, and each patient room: Yes.  ; Findings:  APPEARANCE/BEHAVIOR calm, cooperative and adequate rapport can be established NEURO ASSESSMENT Orientation: time, place and person Hallucinations: No.Auditory Hallucinations and Visual Hallucinations Speech: Normal Gait: normal RESPIRATORY ASSESSMENT Normal expansion.  Clear to auscultation.  No rales, rhonchi, or wheezing. CARDIOVASCULAR ASSESSMENT regular rate and rhythm, S1, S2 normal, no murmur, click, rub or gallop GASTROINTESTINAL ASSESSMENT soft, nontender, BS WNL, no r/g EXTREMITIES normal strength, tone, and muscle mass PLAN OF CARE Provide calm/safe environment. Vital signs assessed twice daily. ED BHU Assessment once each 12-hour shift. Collaborate with intake RN daily or as condition indicates. Assure the ED provider has rounded once each shift. Provide and encourage hygiene. Provide redirection as needed. Assess for escalating behavior; address immediately and inform ED provider.  Assess family dynamic and appropriateness for visitation as needed: Yes.  ; If necessary, describe findings:  Educate the patient/family about BHU procedures/visitation: Yes.  ; If necessary, describe findings:

## 2015-06-07 NOTE — ED Notes (Signed)
Pt to transfer to [facility].  Officer escort called.

## 2015-06-07 NOTE — Consult Note (Signed)
  Psychiatry: Follow-up for this woman with developmental disability and schizophrenia. She has no new complaints today. Behavior is playful sometimes a little agitated but has not been threatening. No new physical complaints.  Review of systems noncontributory.  Disheveled woman slow thinking slow speech. Very simple. Denies suicidal or homicidal ideation. Not issuing any threats and currently cooperative with medicine.  We have been struggling to find appropriate disposition. Nursing and social worker continuing to work on finding an appropriate living situation for her. Supportive counseling done with the patient no change to medicine.

## 2015-06-07 NOTE — ED Notes (Signed)
BEHAVIORAL HEALTH ROUNDING Patient sleeping: Yes.   Patient alert and oriented: not assessed; pt sleeping Behavior appropriate: Yes.  ; If no, describe: sleeping Nutrition and fluids offered: No, pt sleeping Toileting and hygiene offered: No, pt sleeping Sitter present: q15min checks Law enforcement present: Yes   

## 2015-06-07 NOTE — ED Notes (Signed)
BEHAVIORAL HEALTH ROUNDING Patient sleeping: Yes.   Patient alert and oriented: not assessed; pt sleeping Behavior appropriate: Yes.  ; If no, describe: pt sleeping Nutrition and fluids offered: No, pt sleeping Toileting and hygiene offered: No, pt sleeping Sitter present: q72minute checks Law enforcement present: Yes

## 2015-06-07 NOTE — ED Notes (Signed)
Pt laying in bed.  

## 2015-06-07 NOTE — ED Notes (Signed)
BEHAVIORAL HEALTH ROUNDING Patient sleeping: No. Patient alert and oriented: yes Behavior appropriate: Yes.   Nutrition and fluids offered: Yes  Toileting and hygiene offered: Yes  Sitter present: q15 min observations and security camera monitoring Law enforcement present: Yes [employer] 

## 2015-06-07 NOTE — ED Notes (Signed)
BEHAVIORAL HEALTH ROUNDING Patient sleeping: Yes.   Patient alert and oriented: sleeping Behavior appropriate: Yes.  ; If no, describe: sleeping Nutrition and fluids offered: Yes  Toileting and hygiene offered: Yes  Sitter present: q 15 min checks Law enforcement present: Yes  

## 2015-06-07 NOTE — ED Notes (Signed)

## 2015-06-07 NOTE — ED Notes (Signed)
BEHAVIORAL HEALTH ROUNDING Patient sleeping: Yes.   Patient alert and oriented: not applicable Behavior appropriate: Yes.    Nutrition and fluids offered: No Toileting and hygiene offered: No Sitter present: q15 minute observations and security camera monitoring Law enforcement present: Yes [employer] 

## 2015-06-07 NOTE — ED Notes (Signed)
BEHAVIORAL HEALTH ROUNDING Patient sleeping: No. Patient alert and oriented: yes Behavior appropriate: Yes.  ; If no, describe: pt slightly confrontational to RN, demanding to go home and to talk to sister and others.  Pt spoke with sister.  Pt educated on appropriate behavior. Pt is redirectable at this time.  Nutrition and fluids offered: Yes  Toileting and hygiene offered: Yes  Sitter present: q13min checks Law enforcement present: Yes

## 2015-06-07 NOTE — ED Notes (Signed)
Patient is resting comfortably.  Breathing even and unlabored.  NAD. 

## 2015-06-07 NOTE — ED Notes (Signed)
Report received from Sarah RN, pt moved to [facility]

## 2015-06-07 NOTE — ED Notes (Signed)
BEHAVIORAL HEALTH ROUNDING Patient sleeping: No. Patient alert and oriented: yes Behavior appropriate: Yes.   Nutrition and fluids offered: Yes  Toileting and hygiene offered: Yes  Sitter present: q15 min observations and security camera monitoring Law enforcement present: Yes [employer]  ENVIRONMENTAL ASSESSMENT Potentially harmful objects out of patient reach: Yes.   Personal belongings secured: Yes.   Patient dressed in hospital provided attire only: Yes.   Plastic bags out of patient reach: Yes.   Patient care equipment removed: Yes.   Equipment and supplies removed: Yes.   Potentially toxic materials out of patient reach: Yes.   Sharps container removed or out of patient reach: Yes.  ED BHU PLACEMENT JUSTIFICATION Is the patient under IVC or is there intent for IVC: Yes.    Is IVC current? Yes Is the patient medically cleared: Yes.   Is there vacancy in the ED BHU: Yes.   Is the population mix appropriate for patient: Yes.   Is the patient awaiting placement in inpatient or outpatient setting: Yes.   Has the patient had a psychiatric consult: Yes.   Survey of unit performed for contraband, proper placement and condition of furniture, tampering with fixtures in bathroom, shower, and each patient room: Yes.  ; Findings: none APPEARANCE/BEHAVIOR Cooperative,calm NEURO ASSESSMENT Orientation: A&Ox4 Hallucinations: none noted at this time Speech: Normal Gait: normal RESPIRATORY ASSESSMENT Breathing Pattern-regular, no respiratory distress noted CARDIOVASCULAR ASSESSMENT Skin color appropriate for age and race GASTROINTESTINAL ASSESSMENT no GI distress noted EXTREMITIES Moves all extremities, no distress noted PLAN OF CARE Provide calm/safe environment. Vital signs assessed twice daily. ED BHU Assessment once each 12-hour shift. Collaborate with intake RN daily or as condition indicates. Assure the ED provider has rounded once each shift. Provide and encourage hygiene.  Provide redirection as needed. Assess for escalating behavior; address immediately and inform ED provider.  Assess family dynamic and appropriateness for visitation as needed: Yes.  Educate the patient/family about BHU procedures/visitation: Yes.    

## 2015-06-07 NOTE — ED Notes (Signed)
Pt expressing anger.  The telephone number that we have for her sister/guardian [phone]) was called several times by pt and by day shift RN, but no one answered nor returned call.  Night shift HT, RN, and pt also tried the number.  A man answered when the HT was calling and told HT that he knew nothing about the pt.  He had also received a call from the [facility] and had told them the same thing, so we must have the wrong number.  Pt angry about this.  (There is a second number for the sister, but no one answered  [phone]) per Intake.)  Pt was eventually redirected.

## 2015-06-07 NOTE — ED Notes (Signed)
Received report from RN Anna 

## 2015-06-07 NOTE — ED Provider Notes (Signed)
-----------------------------------------   7:13 AM on 06/07/2015 -----------------------------------------   Blood pressure 116/87, pulse 103, temperature 98.3 F (36.8 C), temperature source Oral, resp. rate 18, height  (1.676 m), SpO2 100 %.  The patient had no acute events since last update.  Calm and cooperative at this time.  Disposition is pending per Psychiatry/Behavioral Medicine team recommendations.     Sharyn Creamer, MD 06/07/15 [phone]

## 2015-06-07 NOTE — ED Notes (Signed)
BEHAVIORAL HEALTH ROUNDING Patient sleeping: Yes.   Patient alert and oriented: yes Behavior appropriate: Yes.  ; If no, describe:  Nutrition and fluids offered: yes Toileting and hygiene offered: Yes  Sitter present: yes Law enforcement present: Yes   

## 2015-06-08 ENCOUNTER — Encounter: Payer: Self-pay | Admitting: Emergency Medicine

## 2015-06-08 NOTE — ED Notes (Signed)
She is currently talking on the telephone while sitting in the dayroom   Appropriate to stimulation  No verbalized needs or concerns at this time  NAD assessed  Continue to monitor

## 2015-06-08 NOTE — ED Notes (Signed)
Report received from RN Amy T. 

## 2015-06-08 NOTE — ED Notes (Signed)
She is sitting in the dayroom in a recliner watching Tv and visiting with another pt  Pt observed with no unusual behavior  Appropriate to stimulation  No verbalized needs or concerns at this time  NAD assessed  Continue to monitor

## 2015-06-08 NOTE — ED Notes (Signed)
Supper provided along with an extra drink  Pt states  "i am ready to go home."  Pt reassured that CSW is working on placement for her  Continue to monitor

## 2015-06-08 NOTE — ED Notes (Signed)
BEHAVIORAL HEALTH ROUNDING Patient sleeping: No. Patient alert and oriented: yes Behavior appropriate: Yes.  ; If no, describe:  Nutrition and fluids offered: yes Toileting and hygiene offered: Yes  Sitter present: q15 minute observations and security camera monitoring Law enforcement present: Yes  ODS  

## 2015-06-08 NOTE — ED Notes (Signed)
She has ambulated to the BR    Appropriate to stimulation  No verbalized needs or concerns at this time  NAD assessed  Continue to monitor

## 2015-06-08 NOTE — ED Notes (Signed)
She has come to the NS several times stating   "I don't wanna stay here no more - i am ready to go - get me Arlan Organ here."  i reassured her that we are trying to find her a place to live and that it might take awhile/days  She continues to ask why she cannot leave today  I encouraged her to be patient and she stated that she would try    NAD assessed  Continue to monitor

## 2015-06-08 NOTE — ED Notes (Signed)
BEHAVIORAL HEALTH ROUNDING Patient sleeping: No. Patient alert and oriented: yes Behavior appropriate: Yes.   Nutrition and fluids offered: Yes  Toileting and hygiene offered: Yes  Sitter present: q15 min observations and security camera monitoring Law enforcement present: Yes [employer]  ENVIRONMENTAL ASSESSMENT Potentially harmful objects out of patient reach: Yes.   Personal belongings secured: Yes.   Patient dressed in hospital provided attire only: Yes.   Plastic bags out of patient reach: Yes.   Patient care equipment removed: Yes.   Equipment and supplies removed: Yes.   Potentially toxic materials out of patient reach: Yes.   Sharps container removed or out of patient reach: Yes.  ED BHU PLACEMENT JUSTIFICATION Is the patient under IVC or is there intent for IVC: Yes.    Is IVC current? Yes Is the patient medically cleared: Yes.   Is there vacancy in the ED BHU: Yes.   Is the population mix appropriate for patient: Yes.   Is the patient awaiting placement in inpatient or outpatient setting: Yes.   Has the patient had a psychiatric consult: Yes.   Survey of unit performed for contraband, proper placement and condition of furniture, tampering with fixtures in bathroom, shower, and each patient room: Yes.  ; Findings: none APPEARANCE/BEHAVIOR Cooperative,calm NEURO ASSESSMENT Orientation: A&Ox4 Hallucinations: none noted at this time Speech: Normal Gait: normal RESPIRATORY ASSESSMENT Breathing Pattern-regular, no respiratory distress noted CARDIOVASCULAR ASSESSMENT Skin color appropriate for age and race GASTROINTESTINAL ASSESSMENT no GI distress noted EXTREMITIES Moves all extremities, no distress noted PLAN OF CARE Provide calm/safe environment. Vital signs assessed twice daily. ED BHU Assessment once each 12-hour shift. Collaborate with intake RN daily or as condition indicates. Assure the ED provider has rounded once each shift. Provide and encourage hygiene.  Provide redirection as needed. Assess for escalating behavior; address immediately and inform ED provider.  Assess family dynamic and appropriateness for visitation as needed: Yes.  Educate the patient/family about BHU procedures/visitation: Yes.    

## 2015-06-08 NOTE — ED Notes (Signed)
Pt reports that her stomach hurts and she wants to go lay back down -  I observed her lie down and cover up   NAD assessed   continue to monitor

## 2015-06-08 NOTE — Progress Notes (Signed)
Calls made to the following group homes with no bed availability.   [facility] and [address],  [facility], [facility], [facility], [facility],  [facility], [facility].      [facility], [place] Amie Critchley [phone] will call back to schedule a site visit for Sunday or Monday.    [facility], Clinton Sawyer [phone] is scheduled for a site visit on Monday between 9:00 and 10:00.    CSW will continue to follow-up on referrals   Sammuel Hines. Theresia Majors, MSW Clinical Social Work Department Emergency Room [phone] 5:46 PM

## 2015-06-08 NOTE — ED Notes (Signed)
ivc  Pending  placement 

## 2015-06-08 NOTE — ED Notes (Signed)
BEHAVIORAL HEALTH ROUNDING Patient sleeping: Yes.   Patient alert and oriented: not applicable Behavior appropriate: Yes.    Nutrition and fluids offered: No Toileting and hygiene offered: No Sitter present: q15 minute observations and security camera monitoring Law enforcement present: Yes [employer] 

## 2015-06-08 NOTE — Progress Notes (Signed)
CSW is providing continued assessment on patient and working with DSS to obtain a new group home. Patient informed CSW she wants to go home.  CSW explained to patient why she is unable to return to her friend's house and that we are working to locate a new group home for her.   In reviewing patient's medication list CSW noticed patient was only taking 3 medications however on the previous admissions patient was on nine different medications. Patient does not know what medications she takes at home.   Call to [place], patient's friend.  Stated patient has been living with her since June. However, Rosalie Gums lives in public housing and was unable to allow patient to continue to live with her as the arrangement was only temp which is why she was trying to locate patient a group home.  Per [place], patient has an appointment on Monday 22nd at [facility] to get refills on her medication as patient ran out of some of her medications.  [place] unable to provide CSW with a list of patient's medication as she states they are listed on the current FL2 that was mailed to the potential new group home.   Informed CSW patient gets her medication at [facility] in [place].    CSW provided RN with information onpatient's pharmacy to see if she is able to obtain a list of patient's current medications.   CSW completed FL2 and faxed patient out to the group/family care home below: [facility], [place] [facility] , [facility] , [facility] , [facility] , [facility] , [facility] , [facility] , [facility], [facility] [facility],   [facility] , [place] , [facility],  [facility], [facility],  [facility] , [facility] , [facility] , [place],   [facility] , [facility],  [address] [facility], [facility] [place] , [facility] , [facility] , [facility] , [facility], [employer].   [facility] , [facility] , [facility] , [facility] , [facility], [facility],  [facility]   Sammuel Hines. Theresia Majors, MSW Clinical Social Work Department Emergency Room [phone] 4:36 PM

## 2015-06-08 NOTE — ED Notes (Signed)
Breakfast provided   She has ambulated to the BR and come to the door requesting water  - water provided  No verbalized needs or concerns at this time  Continue to monitor

## 2015-06-08 NOTE — ED Notes (Signed)
ED BHU PLACEMENT JUSTIFICATION Is the patient under IVC or is there intent for IVC: No. Is the patient medically cleared: Yes.   Is there vacancy in the ED BHU: yes Is the population mix appropriate for patient: Yes.   Is the patient awaiting placement in inpatient or outpatient setting: yes  Group home placement  Has the patient had a psychiatric consult:  yes Survey of unit performed for contraband, proper placement and condition of furniture, tampering with fixtures in bathroom, shower, and each patient room: Yes.  ; Findings:  APPEARANCE/BEHAVIOR Calm and cooperative NEURO ASSESSMENT Orientation:  Oriented x3 Hallucinations: No.None noted (Hallucinations) Speech: Normal Gait: normal RESPIRATORY ASSESSMENT Even  unlabored respirations noted  CARDIOVASCULAR ASSESSMENT Regular rate  Pulses equal  Skin warm and dry   GASTROINTESTINAL ASSESSMENT no GI complaint EXTREMITIES Full ROM  PLAN OF CARE Provide calm/safe environment. Vital signs assessed twice daily. ED BHU Assessment once each 12-hour shift. Collaborate with intake RN daily or as condition indicates. Assure the ED provider has rounded once each shift. Provide and encourage hygiene. Provide redirection as needed. Assess for escalating behavior; address immediately and inform ED provider.  Assess family dynamic and appropriateness for visitation as needed: Yes.  ; If necessary, describe findings:  Educate the patient/family about BHU procedures/visitation: Yes.  ; If necessary, describe findings:

## 2015-06-08 NOTE — ED Notes (Signed)
Lunch provided along with an extra drink   Am meds administered as ordered   Appropriate to stimulation  No verbalized needs or concerns at this time  NAD assessed  Continue to monitor

## 2015-06-08 NOTE — ED Notes (Signed)

## 2015-06-08 NOTE — ED Notes (Signed)
Patient observed lying in bed with eyes closed  Even, unlabored respirations observed   NAD pt appears to be sleeping  I will continue to monitor along with every 15 minute visual observations and ongoing security camera monitoring    

## 2015-06-08 NOTE — ED Provider Notes (Signed)
-----------------------------------------   6:41 AM on 06/08/2015 -----------------------------------------   Blood pressure 123/81, pulse 87, temperature 98.4 F (36.9 C), temperature source Oral, resp. rate 18, height  (1.676 m), SpO2 100 %.  The patient had no acute events since last update.  Calm and cooperative at this time.  Disposition is pending per Psychiatry/Behavioral Medicine team recommendations.     Irean Hong, MD 06/08/15 [phone]

## 2015-06-09 MED ORDER — CEPHALEXIN 500 MG PO CAPS
ORAL_CAPSULE | ORAL | Status: AC
  Administered 2015-06-09: 500 mg via ORAL
  Filled 2015-06-09: qty 1

## 2015-06-09 NOTE — ED Notes (Signed)
She is up and has come to the nurse's station  She is tapping on the window  I went out   Pt states  "I want to go home."  Anxiety noted  "i am ready to go home."  I tried to ease her but she kept repeating her desire to leave  Pt informed of pending group home assessment on Monday and that she will most likely not leave here today  Pt expressing nervousness - tearful

## 2015-06-09 NOTE — ED Notes (Signed)
BEHAVIORAL HEALTH ROUNDING Patient sleeping: Yes.   Patient alert and oriented: yes Behavior appropriate: Yes.  ; If no, describe:  Nutrition and fluids offered: Yes  Toileting and hygiene offered: Yes  Sitter present: yes Law enforcement present: Yes [employer]  

## 2015-06-09 NOTE — ED Notes (Signed)
Pt ambulating around day room. Pt began crying after having a meeting with potential group home. Pt able to be redirected back to her room, but repeatedly walks out and taps on glass to tell staff "I want to go home".

## 2015-06-09 NOTE — Progress Notes (Signed)
Site visit by two group homes, [place] and [facility]. [facility] had no opening and brought another group home to meet patient.  [place] was the group home that patient was previously going to discharge, however due to a conflict with patient's current guardian group home would not take patient.    CSW met with group home owner Pamala Hurry [phone], she is now willing to take patient as long as DSS is involved.  Group home is able to take patient tomorrow after CSW talks with DSS to make sure they are going to start the process to take over guardianship.    Call to Milus Glazier [phone] APS social worker, left message to inform her Janell Quiet with [facility] has agreed to take patient.  Call from patient's guardian, provided her an update on the site visit, she is also in agreement with patient going to this group home.   CSW will follow up with DSS on Monday and coordinate patient's placement.   Casimer Lanius. Latanya Presser, [place] [facility] Emergency Room [phone] 6:34 PM

## 2015-06-09 NOTE — ED Notes (Signed)
Am meds administered as ordered along with her PRN for anxiety  Assessment completed  Pt denies desire to take a shower at this time  Denies pain

## 2015-06-09 NOTE — ED Notes (Signed)
BEHAVIORAL HEALTH ROUNDING Patient sleeping: Yes.   Patient alert and oriented: not applicable Behavior appropriate: Yes.    Nutrition and fluids offered: No Toileting and hygiene offered: No Sitter present: q15 minute observations Law enforcement present: Yes [employer] 

## 2015-06-09 NOTE — ED Notes (Signed)
NAD noted at this time. Pt visualized at this time laying in bed with TV off. Respirations even and unlabored.

## 2015-06-09 NOTE — ED Notes (Signed)
BEHAVIORAL HEALTH ROUNDING Patient sleeping: No. Patient alert and oriented: yes Behavior appropriate: Yes.  ; If no, describe:  Nutrition and fluids offered: Yes Toileting and hygiene offered: Yes  Sitter present: yes Law enforcement present: Yes ODS  

## 2015-06-09 NOTE — ED Notes (Signed)
Lunch provided along with an extra drink  Pt observed with no unusual behavior  Appropriate to stimulation  No verbalized needs or concerns at this time  NAD assessed  Continue to monitor 

## 2015-06-09 NOTE — ED Notes (Signed)
BEHAVIORAL HEALTH ROUNDING Patient sleeping: No. Patient alert and oriented: yes Behavior appropriate: Yes.  ; If no, describe:  Nutrition and fluids offered: yes Toileting and hygiene offered: Yes  Sitter present: q15 minute observations and security camera monitoring Law enforcement present: Yes  ODS  

## 2015-06-09 NOTE — ED Notes (Signed)
BEHAVIORAL HEALTH ROUNDING Patient sleeping: Yes.   Patient alert and oriented: not applicable Behavior appropriate: Yes.    Nutrition and fluids offered: No Toileting and hygiene offered: No Sitter present: q15 minute observations and security camera monitoring Law enforcement present: Yes [employer] 

## 2015-06-09 NOTE — ED Notes (Signed)
Breakfast provided   Patient observed lying in bed with eyes closed  Even, unlabored respirations observed   NAD pt appears to be sleeping  I will continue to monitor along with every 15 minute visual observations and ongoing security camera monitoring    

## 2015-06-09 NOTE — ED Provider Notes (Signed)
-----------------------------------------   6:24 AM on 06/09/2015 -----------------------------------------   Blood pressure 110/74, pulse 87, temperature 98 F (36.7 C), temperature source Oral, resp. rate 18, height  (1.676 m), SpO2 100 %.  The patient had no acute events since last update.  Calm and cooperative at this time.  Disposition is pending per Psychiatry/Behavioral Medicine team recommendations.     Irean Hong, MD 06/09/15 [phone]

## 2015-06-09 NOTE — ED Notes (Signed)
ED BHU PLACEMENT JUSTIFICATION Is the patient under IVC or is there intent for IVC: Yes.   Is the patient medically cleared: Yes.   Is there vacancy in the ED BHU: Yes.   Is the population mix appropriate for patient: Yes.   Is the patient awaiting placement in inpatient or outpatient setting: Yes.   Has the patient had a psychiatric consult: Yes.   Survey of unit performed for contraband, proper placement and condition of furniture, tampering with fixtures in bathroom, shower, and each patient room: Yes.  ; Findings:  APPEARANCE/BEHAVIOR calm and cooperative NEURO ASSESSMENT Orientation: place and person Hallucinations: No.None noted (Hallucinations) Speech: Normal Gait: normal RESPIRATORY ASSESSMENT Normal expansion.  Clear to auscultation.  No rales, rhonchi, or wheezing. CARDIOVASCULAR ASSESSMENT regular rate and rhythm, S1, S2 normal, no murmur, click, rub or gallop GASTROINTESTINAL ASSESSMENT soft, nontender, BS WNL, no r/g EXTREMITIES normal strength, tone, and muscle mass PLAN OF CARE Provide calm/safe environment. Vital signs assessed twice daily. ED BHU Assessment once each 12-hour shift. Collaborate with intake RN daily or as condition indicates. Assure the ED provider has rounded once each shift. Provide and encourage hygiene. Provide redirection as needed. Assess for escalating behavior; address immediately and inform ED provider.  Assess family dynamic and appropriateness for visitation as needed: Yes.  ; If necessary, describe findings:  Educate the patient/family about BHU procedures/visitation: Yes.  ; If necessary, describe findings:  

## 2015-06-09 NOTE — ED Notes (Signed)
Patient observed lying in bed with eyes closed  Even, unlabored respirations observed   NAD pt appears to be sleeping  I will continue to monitor along with every 15 minute visual observations and ongoing security camera monitoring    

## 2015-06-09 NOTE — ED Notes (Signed)
Pt observed sitting in a recliner in the DR   Drink provided Pt observed with no unusual behavior  Appropriate to stimulation  No verbalized needs or concerns at this time  NAD assessed  Continue to monitor

## 2015-06-09 NOTE — ED Notes (Signed)
ED BHU PLACEMENT JUSTIFICATION Is the patient under IVC or is there intent for IVC: Yes.   Is the patient medically cleared: Yes.   Is there vacancy in the ED BHU: Yes.   Is the population mix appropriate for patient: Yes.   Is the patient awaiting placement in inpatient or outpatient setting: Yes.  Group home placement    Has the patient had a psychiatric consult: Yes.   Survey of unit performed for contraband, proper placement and condition of furniture, tampering with fixtures in bathroom, shower, and each patient room: Yes.  ; Findings:  APPEARANCE/BEHAVIOR Calm and cooperative NEURO ASSESSMENT Orientation: oriented x3  Denies pain Hallucinations: No.None noted (Hallucinations) Speech: Normal Gait: normal RESPIRATORY ASSESSMENT Even  Unlabored respirations  CARDIOVASCULAR ASSESSMENT Pulses equal   regular rate  Skin warm and dry   GASTROINTESTINAL ASSESSMENT no GI complaint EXTREMITIES Full ROM  PLAN OF CARE Provide calm/safe environment. Vital signs assessed twice daily. ED BHU Assessment once each 12-hour shift. Collaborate with intake RN daily or as condition indicates. Assure the ED provider has rounded once each shift. Provide and encourage hygiene. Provide redirection as needed. Assess for escalating behavior; address immediately and inform ED provider.  Assess family dynamic and appropriateness for visitation as needed: Yes.  ; If necessary, describe findings:  Educate the patient/family about BHU procedures/visitation: Yes.  ; If necessary, describe findings:   

## 2015-06-09 NOTE — ED Notes (Signed)
Pt ambulating in room. NAD noted at this time

## 2015-06-09 NOTE — ED Notes (Signed)
ENVIRONMENTAL ASSESSMENT Potentially harmful objects out of patient reach: Yes.   Personal belongings secured: Yes.   Patient dressed in hospital provided attire only: Yes.   Plastic bags out of patient reach: Yes.   Patient care equipment (cords, cables, call bells, lines, and drains) shortened, removed, or accounted for: Yes.   Equipment and supplies removed from bottom of stretcher: Yes.   Potentially toxic materials out of patient reach: Yes.   Sharps container removed or out of patient reach: Yes.    BEHAVIORAL HEALTH ROUNDING Patient sleeping: Yes.   Patient alert and oriented: eyes closed  Appears asleep Behavior appropriate: Yes.  ; If no, describe:  Nutrition and fluids offered: Yes  Toileting and hygiene offered: sleeping Sitter present: q 15 minute observations and security camera monitoring Law enforcement present: yes  [employer] 

## 2015-06-09 NOTE — ED Notes (Signed)
Drink, ice cream and graham crackers provided  She has been sitting in the dayroom  Pt observed with no unusual behavior  Appropriate to stimulation  No verbalized needs or concerns at this time  NAD assessed  Continue to monitor

## 2015-06-10 MED ORDER — LORAZEPAM 2 MG PO TABS: 2.0000 mg | ORAL_TABLET | Freq: Four times a day (QID) | ORAL | Status: AC | PRN

## 2015-06-10 MED ORDER — RISPERIDONE 2 MG PO TBDP: 2.0000 mg | ORAL_TABLET | Freq: Two times a day (BID) | ORAL | Status: DC

## 2015-06-10 MED ORDER — RISPERIDONE 2 MG PO TABS: 2.0000 mg | ORAL_TABLET | Freq: Two times a day (BID) | ORAL | Status: AC

## 2015-06-10 MED ORDER — CEPHALEXIN 500 MG PO CAPS
ORAL_CAPSULE | ORAL | Status: AC
  Administered 2015-06-10: 500 mg via ORAL
  Filled 2015-06-10: qty 1

## 2015-06-10 MED ORDER — CEPHALEXIN 500 MG PO CAPS: 500.0000 mg | ORAL_CAPSULE | Freq: Two times a day (BID) | ORAL | Status: AC

## 2015-06-10 MED ORDER — LORAZEPAM 2 MG PO TABS
ORAL_TABLET | ORAL | Status: AC
  Administered 2015-06-10: 2 mg via ORAL
  Filled 2015-06-10: qty 1

## 2015-06-10 MED ORDER — CEPHALEXIN 500 MG PO CAPS: 500.0000 mg | ORAL_CAPSULE | Freq: Two times a day (BID) | ORAL | Status: DC

## 2015-06-10 NOTE — ED Notes (Signed)
1/1 bags of belongings returned to her  - she verbalizes that she has received back all belongings that she came here with  CSW has notified her sister/guardian of her discharge status  Discharge instructions provided to Elmon Else upon pt leaving this unit  Pt observed with no unusual behavior  Appropriate to stimulation  No verbalized needs or concerns at this time  NAD assessed

## 2015-06-10 NOTE — ED Notes (Addendum)
BEHAVIORAL HEALTH ROUNDING Patient sleeping: Yes.   Patient alert and oriented: no Behavior appropriate: Yes.  ; If no, describe:   Nutrition and fluids offered: No Toileting and hygiene offered: No Sitter present: no Law enforcement present: Yes  and [employer] 

## 2015-06-10 NOTE — ED Notes (Signed)
BEHAVIORAL HEALTH ROUNDING Patient sleeping: Yes.   Patient alert and oriented: no Behavior appropriate: Yes.  ; If no, describe:   Nutrition and fluids offered: No Toileting and hygiene offered: No Sitter present: no Law enforcement present: Yes  and [employer] 

## 2015-06-10 NOTE — Discharge Instructions (Signed)
Urinary Tract Infection Urinary tract infections (UTIs) can develop anywhere along your urinary tract. Your urinary tract is your body's drainage system for removing wastes and extra water. Your urinary tract includes two kidneys, two ureters, a bladder, and a urethra. Your kidneys are a pair of bean-shaped organs. Each kidney is about the size of your fist. They are located below your ribs, one on each side of your spine. CAUSES Infections are caused by microbes, which are microscopic organisms, including fungi, viruses, and bacteria. These organisms are so small that they can only be seen through a microscope. Bacteria are the microbes that most commonly cause UTIs. SYMPTOMS  Symptoms of UTIs may vary by age and gender of the patient and by the location of the infection. Symptoms in young women typically include a frequent and intense urge to urinate and a painful, burning feeling in the bladder or urethra during urination. Older women and men are more likely to be tired, shaky, and weak and have muscle aches and abdominal pain. A fever may mean the infection is in your kidneys. Other symptoms of a kidney infection include pain in your back or sides below the ribs, nausea, and vomiting. DIAGNOSIS To diagnose a UTI, your caregiver will ask you about your symptoms. Your caregiver also will ask to provide a urine sample. The urine sample will be tested for bacteria and white blood cells. White blood cells are made by your body to help fight infection. TREATMENT  Typically, UTIs can be treated with medication. Because most UTIs are caused by a bacterial infection, they usually can be treated with the use of antibiotics. The choice of antibiotic and length of treatment depend on your symptoms and the type of bacteria causing your infection. HOME CARE INSTRUCTIONS  If you were prescribed antibiotics, take them exactly as your caregiver instructs you. Finish the medication even if you feel better after you  have only taken some of the medication.  Drink enough water and fluids to keep your urine clear or pale yellow.  Avoid caffeine, tea, and carbonated beverages. They tend to irritate your bladder.  Empty your bladder often. Avoid holding urine for long periods of time.  Empty your bladder before and after sexual intercourse.  After a bowel movement, women should cleanse from front to back. Use each tissue only once. SEEK MEDICAL CARE IF:   You have back pain.  You develop a fever.  Your symptoms do not begin to resolve within 3 days. SEEK IMMEDIATE MEDICAL CARE IF:   You have severe back pain or lower abdominal pain.  You develop chills.  You have nausea or vomiting.  You have continued burning or discomfort with urination. MAKE SURE YOU:   Understand these instructions.  Will watch your condition.  Will get help right away if you are not doing well or get worse. Document Released: 07/15/2005 Document Revised: 04/05/2012 Document Reviewed: 11/13/2011 [facility] Patient Information 2015 [place], [place]. This information is not intended to replace advice given to you by your health care provider. Make sure you discuss any questions you have with your health care provider.  Bipolar Disorder Bipolar disorder is a mental illness. The term bipolar disorder actually is used to describe a group of disorders that all share varying degrees of emotional highs and lows that can interfere with daily functioning, such as work, school, or relationships. Bipolar disorder also can lead to drug abuse, hospitalization, and suicide. The emotional highs of bipolar disorder are periods of elation or irritability  and high energy. These highs can range from a mild form (hypomania) to a severe form (mania). People experiencing episodes of hypomania may appear energetic, excitable, and highly productive. People experiencing mania may behave impulsively or erratically. They often make poor decisions. They  may have difficulty sleeping. The most severe episodes of mania can involve having very distorted beliefs or perceptions about the world and seeing or hearing things that are not real (psychotic delusions and hallucinations).  The emotional lows of bipolar disorder (depression) also can range from mild to severe. Severe episodes of bipolar depression can involve psychotic delusions and hallucinations. Sometimes people with bipolar disorder experience a state of mixed mood. Symptoms of hypomania or mania and depression are both present during this mixed-mood episode. SIGNS AND SYMPTOMS There are signs and symptoms of the episodes of hypomania and mania as well as the episodes of depression. The signs and symptoms of hypomania and mania are similar but vary in severity. They include:  Inflated self-esteem or feeling of increased self-confidence.  Decreased need for sleep.  Unusual talkativeness (rapid or pressured speech) or the feeling of a need to keep talking.  Sensation of racing thoughts or constant talking, with quick shifts between topics that may or may not be related (flight of ideas).  Decreased ability to focus or concentrate.  Increased purposeful activity, such as work, studies, or social activity, or nonproductive activity, such as pacing, squirming and fidgeting, or finger and toe tapping.  Impulsive behavior and use of poor judgment, resulting in high-risk activities, such as having unprotected sex or spending excessive amounts of money. Signs and symptoms of depression include the following:   Feelings of sadness, hopelessness, or helplessness.  Frequent or uncontrollable episodes of crying.  Lack of feeling anything or caring about anything.  Difficulty sleeping or sleeping too much.  Inability to enjoy the things you used to enjoy.   Desire to be alone all the time.   Feelings of guilt or worthlessness.  Lack of energy or motivation.   Difficulty  concentrating, remembering, or making decisions.  Change in appetite or weight beyond normal fluctuations.  Thoughts of death or the desire to harm yourself. DIAGNOSIS  Bipolar disorder is diagnosed through an assessment by your caregiver. Your caregiver will ask questions about your emotional episodes. There are two main types of bipolar disorder. People with type I bipolar disorder have manic episodes with or without depressive episodes. People with type II bipolar disorder have hypomanic episodes and major depressive episodes, which are more serious than mild depression. The type of bipolar disorder you have can make an important difference in how your illness is monitored and treated. Your caregiver may ask questions about your medical history and use of alcohol or drugs, including prescription medication. Certain medical conditions and substances also can cause emotional highs and lows that resemble bipolar disorder (secondary bipolar disorder).  TREATMENT  Bipolar disorder is a long-term illness. It is best controlled with continuous treatment rather than treatment only when symptoms occur. The following treatments can be prescribed for bipolar disorders:  Medication--Medication can be prescribed by a doctor that is an expert in treating mental disorders (psychiatrists). Medications called mood stabilizers are usually prescribed to help control the illness. Other medications are sometimes added if symptoms of mania, depression, or psychotic delusions and hallucinations occur despite the use of a mood stabilizer.  Talk therapy--Some forms of talk therapy are helpful in providing support, education, and guidance. A combination of medication and talk therapy is  best for managing the disorder over time. A procedure in which electricity is applied to your brain through your scalp (electroconvulsive therapy) is used in cases of severe mania when medication and talk therapy do not work or work too  slowly. Document Released: 01/11/2001 Document Revised: 01/30/2013 Document Reviewed: 10/31/2012 [facility] Patient Information 2015 [place], [place]. This information is not intended to replace advice given to you by your health care provider. Make sure you discuss any questions you have with your health care provider.

## 2015-06-10 NOTE — ED Notes (Signed)
Breakfast provided   Patient observed lying in bed with eyes closed  Even, unlabored respirations observed   NAD pt appears to be sleeping  I will continue to monitor along with every 15 minute visual observations and ongoing security camera monitoring    

## 2015-06-10 NOTE — ED Notes (Signed)
I have still not seen a CSW on the unit today - i still have no update and the pt continues to say  "I want to go home - can I go somewhere today - i want to go home."  Pt reassured

## 2015-06-10 NOTE — ED Notes (Signed)

## 2015-06-10 NOTE — ED Notes (Signed)
Am meds administered as ordered  Assessment completed  She denies pain  VSS  Awaiting placement to group home  Pt observed with no unusual behavior  Appropriate to stimulation  No verbalized needs or concerns at this time  NAD assessed  Continue to monitor

## 2015-06-10 NOTE — ED Notes (Signed)
BEHAVIORAL HEALTH ROUNDING Patient sleeping: No. Patient alert and oriented: yes Behavior appropriate: Yes.  ; If no, describe:  Nutrition and fluids offered: yes Toileting and hygiene offered: Yes  Sitter present: q15 minute observations and security camera monitoring Law enforcement present: Yes  ODS  

## 2015-06-10 NOTE — ED Notes (Signed)
Pt out of shower and returned all items to RN and tech. Pt returned to room.

## 2015-06-10 NOTE — ED Notes (Signed)
Pt walking in the DR - "I want to go home - I want to call my new group home and talk to the woman."   I reassured her that once I hear news from the CSW that I will kkep her informed but at this time - I do not know

## 2015-06-10 NOTE — ED Notes (Signed)
Pt given lunch tray.

## 2015-06-10 NOTE — ED Notes (Signed)
Supper provided along with an extra drink  Pt observed with no unusual behavior  Appropriate to stimulation  No verbalized needs or concerns at this time  NAD assessed  Continue to monitor 

## 2015-06-10 NOTE — ED Provider Notes (Signed)
-----------------------------------------   3:43 PM on 06/10/2015 -----------------------------------------   Blood pressure 116/72, pulse 83, temperature 98.5 F (36.9 C), temperature source Oral, resp. rate 18, height  (1.676 m), SpO2 100 %.  The patient had no acute events since last update.  Calm and cooperative at this time.  Disposition is pending per Psychiatry/Behavioral Medicine team recommendations.    Patient remained stable and is been cleared by psychiatry. Patient apparently be discharged and to a group home and is been accepted to the group home. We will prescribe her the medication or giving here for urinary tract infection and also Risperdal which is been given here on a regular basis.  Jennye Moccasin, MD 06/10/15 [phone]

## 2015-06-10 NOTE — ED Notes (Addendum)
Received a call from Claudine CSW and she reports that Elmon Else will be here at 1600 to accept Ms Spease into her group home  [facility]     [phone] number provided to CSW so she can complete her [facility]     ED secretary informed

## 2015-06-10 NOTE — ED Notes (Signed)
Pt taking a shower and was given breakfast tray.

## 2015-06-10 NOTE — ED Notes (Addendum)
Pt appear called staff "evil white devils" stated he didn't know this RN and "fuck off", pt behavior with swinging arm gestures and indirect eye contact, pt behavior abated by presence of [employer] security, pt then apologized and stated "y'all scared me, I didn't mean it", pt then with good eye contact and smiling

## 2015-06-10 NOTE — ED Notes (Signed)
ED BHU PLACEMENT JUSTIFICATION Is the patient under IVC or is there intent for IVC: Yes.   Is the patient medically cleared: Yes.   Is there vacancy in the ED BHU: Yes.   Is the population mix appropriate for patient: Yes.   Is the patient awaiting placement in inpatient or outpatient setting: Yes - group home placement   Has the patient had a psychiatric consult: Yes.   Survey of unit performed for contraband, proper placement and condition of furniture, tampering with fixtures in bathroom, shower, and each patient room: Yes.  ; Findings:  APPEARANCE/BEHAVIOR Calm and cooperative NEURO ASSESSMENT Orientation: oriented x3  Denies pain Hallucinations: No.None noted (Hallucinations) Speech: Normal Gait: normal RESPIRATORY ASSESSMENT Even  Unlabored respirations  CARDIOVASCULAR ASSESSMENT Pulses equal   regular rate  Skin warm and dry   GASTROINTESTINAL ASSESSMENT no GI complaint EXTREMITIES Full ROM  PLAN OF CARE Provide calm/safe environment. Vital signs assessed twice daily. ED BHU Assessment once each 12-hour shift. Collaborate with intake RN daily or as condition indicates. Assure the ED provider has rounded once each shift. Provide and encourage hygiene. Provide redirection as needed. Assess for escalating behavior; address immediately and inform ED provider.  Assess family dynamic and appropriateness for visitation as needed: Yes.  ; If necessary, describe findings:  Educate the patient/family about BHU procedures/visitation: Yes.  ; If necessary, describe findings:   

## 2015-06-10 NOTE — ED Notes (Signed)
Lunch provided along with an extra drink  Pt observed with no unusual behavior  Appropriate to stimulation  No verbalized needs or concerns at this time  NAD assessed  Continue to monitor 

## 2015-06-10 NOTE — ED Notes (Signed)
ED BHU PLACEMENT JUSTIFICATION Is the patient under IVC or is there intent for IVC: Yes.   Is the patient medically cleared: Yes.   Is there vacancy in the ED BHU: Yes.   Is the population mix appropriate for patient: Yes.   Is the patient awaiting placement in inpatient or outpatient setting: Yes.   Has the patient had a psychiatric consult: Yes.   Survey of unit performed for contraband, proper placement and condition of furniture, tampering with fixtures in bathroom, shower, and each patient room: Yes.  ; Findings: all clear APPEARANCE/BEHAVIOR calm, cooperative and adequate rapport can be established NEURO ASSESSMENT Orientation: time, place and person Hallucinations: No.None noted (Hallucinations) Speech: Normal Gait: normal RESPIRATORY ASSESSMENT Normal expansion.  Clear to auscultation.  No rales, rhonchi, or wheezing. CARDIOVASCULAR ASSESSMENT regular rate and rhythm, S1, S2 normal, no murmur, click, rub or gallop GASTROINTESTINAL ASSESSMENT soft, nontender, BS WNL, no r/g EXTREMITIES normal strength, tone, and muscle mass PLAN OF CARE Provide calm/safe environment. Vital signs assessed twice daily. ED BHU Assessment once each 12-hour shift. Collaborate with intake RN daily or as condition indicates. Assure the ED provider has rounded once each shift. Provide and encourage hygiene. Provide redirection as needed. Assess for escalating behavior; address immediately and inform ED provider.  Assess family dynamic and appropriateness for visitation as needed: No.; If necessary, describe findings: unable to assess Educate the patient/family about BHU procedures/visitation: Yes.  ; If necessary, describe findings:    ENVIRONMENTAL ASSESSMENT Potentially harmful objects out of patient reach: Yes.   Personal belongings secured: Yes.   Patient dressed in hospital provided attire only: Yes.   Plastic bags out of patient reach: Yes.   Patient care equipment (cords, cables, call bells,  lines, and drains) shortened, removed, or accounted for: Yes.   Equipment and supplies removed from bottom of stretcher: Yes.   Potentially toxic materials out of patient reach: Yes.   Sharps container removed or out of patient reach: Yes.   

## 2015-06-10 NOTE — ED Notes (Signed)
Pt informed of her pending discharge -  Drink provided  Pt observed with no unusual behavior  Appropriate to stimulation  No verbalized needs or concerns at this time  NAD assessed  Continue to monitor

## 2015-06-10 NOTE — Progress Notes (Signed)
LCSW received call from Group home provider- Vennie Homans, she has agreed to take patient and pick her up at 4pm today. Her contact information is as Follows: [address] [place] [address] [phone].  Revised FL2 will be faxed over to [phone]

## 2015-06-10 NOTE — ED Notes (Signed)
Patient observed lying in bed with eyes closed  Even, unlabored respirations observed   NAD pt appears to be sleeping  I will continue to monitor along with every 15 minute visual observations and ongoing security camera monitoring    

## 2015-06-10 NOTE — Progress Notes (Signed)
LCSW called DSS Mickeal Skinner and left a detailed message and awaiting call back [phone]  LCSW called Clinton Sawyer [phone] and postponed his visit today as another group home Beatriz Chancellor [phone]) has agreed to take patient conditional to her having a DSS guardian assigned.

## 2015-06-13 ENCOUNTER — Other Ambulatory Visit: Payer: Self-pay | Admitting: Psychiatry

## 2015-12-04 ENCOUNTER — Emergency Department (HOSPITAL_COMMUNITY)
Admission: EM | Admit: 2015-12-04 | Discharge: 2015-12-09 | Payer: Medicaid Other | Attending: Physician Assistant | Admitting: Physician Assistant

## 2015-12-04 ENCOUNTER — Encounter (HOSPITAL_COMMUNITY): Payer: Self-pay | Admitting: Emergency Medicine

## 2015-12-04 DIAGNOSIS — Z79899 Other long term (current) drug therapy: Secondary | ICD-10-CM | POA: Insufficient documentation

## 2015-12-04 DIAGNOSIS — J45909 Unspecified asthma, uncomplicated: Secondary | ICD-10-CM | POA: Diagnosis not present

## 2015-12-04 DIAGNOSIS — R451 Restlessness and agitation: Secondary | ICD-10-CM | POA: Insufficient documentation

## 2015-12-04 DIAGNOSIS — F250 Schizoaffective disorder, bipolar type: Secondary | ICD-10-CM | POA: Insufficient documentation

## 2015-12-04 DIAGNOSIS — J101 Influenza due to other identified influenza virus with other respiratory manifestations: Secondary | ICD-10-CM | POA: Insufficient documentation

## 2015-12-04 DIAGNOSIS — Z3202 Encounter for pregnancy test, result negative: Secondary | ICD-10-CM | POA: Insufficient documentation

## 2015-12-04 DIAGNOSIS — Z862 Personal history of diseases of the blood and blood-forming organs and certain disorders involving the immune mechanism: Secondary | ICD-10-CM | POA: Insufficient documentation

## 2015-12-04 DIAGNOSIS — Z792 Long term (current) use of antibiotics: Secondary | ICD-10-CM | POA: Insufficient documentation

## 2015-12-04 DIAGNOSIS — R45851 Suicidal ideations: Secondary | ICD-10-CM | POA: Diagnosis present

## 2015-12-04 DIAGNOSIS — E669 Obesity, unspecified: Secondary | ICD-10-CM | POA: Diagnosis not present

## 2015-12-04 DIAGNOSIS — F419 Anxiety disorder, unspecified: Secondary | ICD-10-CM | POA: Diagnosis not present

## 2015-12-04 DIAGNOSIS — F70 Mild intellectual disabilities: Secondary | ICD-10-CM | POA: Diagnosis not present

## 2015-12-04 LAB — COMPREHENSIVE METABOLIC PANEL
ALK PHOS: 70 U/L (ref 38–126)
ALT: 20 U/L (ref 14–54)
AST: 20 U/L (ref 15–41)
Albumin: 4 g/dL (ref 3.5–5.0)
Anion gap: 9 (ref 5–15)
BILIRUBIN TOTAL: 0.4 mg/dL (ref 0.3–1.2)
BUN: 8 mg/dL (ref 6–20)
CALCIUM: 9.2 mg/dL (ref 8.9–10.3)
CO2: 20 mmol/L — ABNORMAL LOW (ref 22–32)
CREATININE: 0.6 mg/dL (ref 0.44–1.00)
Chloride: 108 mmol/L (ref 101–111)
GFR calc Af Amer: >60 mL/min (ref >60)
Glucose, Bld: 94 mg/dL (ref 65–99)
Potassium: 3.8 mmol/L (ref 3.5–5.1)
Sodium: 137 mmol/L (ref 135–145)
TOTAL PROTEIN: 8.2 g/dL — AB (ref 6.5–8.1)

## 2015-12-04 LAB — ETHANOL

## 2015-12-04 LAB — CBC
HCT: 32.8 % — ABNORMAL LOW (ref 36.0–46.0)
Hemoglobin: 10.9 g/dL — ABNORMAL LOW (ref 12.0–15.0)
MCH: 24.3 pg — AB (ref 26.0–34.0)
MCHC: 33.2 g/dL (ref 30.0–36.0)
MCV: 73.1 fL — AB (ref 78.0–100.0)
PLATELETS: 167 10*3/uL (ref 150–400)
RBC: 4.49 MIL/uL (ref 3.87–5.11)
RDW: 17.7 % — AB (ref 11.5–15.5)
WBC: 11.5 10*3/uL — ABNORMAL HIGH (ref 4.0–10.5)

## 2015-12-04 LAB — SALICYLATE LEVEL: Salicylate Lvl: <4 mg/dL (ref 2.8–30.0)

## 2015-12-04 LAB — ACETAMINOPHEN LEVEL: Acetaminophen (Tylenol), Serum: <10 ug/mL — ABNORMAL LOW (ref 10–30)

## 2015-12-04 NOTE — ED Notes (Signed)
Patient belongings in bag. Necklace, bracelet, pants, socks, shirt. Patient was cooperative.
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
## 2015-12-05 DIAGNOSIS — F250 Schizoaffective disorder, bipolar type: Secondary | ICD-10-CM | POA: Diagnosis not present

## 2015-12-05 DIAGNOSIS — F70 Mild intellectual disabilities: Secondary | ICD-10-CM | POA: Diagnosis not present

## 2015-12-05 LAB — URINALYSIS, ROUTINE W REFLEX MICROSCOPIC
BILIRUBIN URINE: NEGATIVE
GLUCOSE, UA: NEGATIVE mg/dL
HGB URINE DIPSTICK: NEGATIVE
KETONES UR: NEGATIVE mg/dL
LEUKOCYTES UA: NEGATIVE
Nitrite: NEGATIVE
PH: 7 (ref 5.0–8.0)
Protein, ur: NEGATIVE mg/dL
Specific Gravity, Urine: 1.007 (ref 1.005–1.030)

## 2015-12-05 LAB — RAPID URINE DRUG SCREEN, HOSP PERFORMED
AMPHETAMINES: NOT DETECTED
BARBITURATES: NOT DETECTED
BENZODIAZEPINES: NOT DETECTED
COCAINE: NOT DETECTED
OPIATES: NOT DETECTED
TETRAHYDROCANNABINOL: NOT DETECTED

## 2015-12-05 LAB — POC URINE PREG, ED: Preg Test, Ur: NEGATIVE

## 2015-12-05 MED ORDER — CARBAMAZEPINE 200 MG PO TABS
200.0000 mg | ORAL_TABLET | Freq: Two times a day (BID) | ORAL | Status: DC
  Administered 2015-12-05 – 2015-12-09 (×8): 200 mg via ORAL
  Filled 2015-12-05 (×11): qty 1

## 2015-12-05 MED ORDER — TRAZODONE HCL 100 MG PO TABS
100.0000 mg | ORAL_TABLET | Freq: Every day | ORAL | Status: DC
  Administered 2015-12-05 – 2015-12-07 (×3): 100 mg via ORAL
  Filled 2015-12-05 (×3): qty 1

## 2015-12-05 MED ORDER — DIPHENHYDRAMINE HCL 50 MG/ML IJ SOLN: 25.0000 mg | Freq: Once | INTRAMUSCULAR | Status: DC

## 2015-12-05 MED ORDER — OLANZAPINE 10 MG PO TBDP
10.0000 mg | ORAL_TABLET | Freq: Three times a day (TID) | ORAL | Status: DC | PRN
  Administered 2015-12-06 – 2015-12-09 (×3): 10 mg via ORAL
  Filled 2015-12-05 (×3): qty 1

## 2015-12-05 MED ORDER — BENZTROPINE MESYLATE 1 MG PO TABS
1.0000 mg | ORAL_TABLET | Freq: Two times a day (BID) | ORAL | Status: DC
  Administered 2015-12-05 – 2015-12-09 (×9): 1 mg via ORAL
  Filled 2015-12-05 (×9): qty 1

## 2015-12-05 MED ORDER — LORAZEPAM 2 MG/ML IJ SOLN: 2.0000 mg | Freq: Once | INTRAMUSCULAR | Status: DC

## 2015-12-05 MED ORDER — HALOPERIDOL 5 MG PO TABS
5.0000 mg | ORAL_TABLET | Freq: Two times a day (BID) | ORAL | Status: DC
  Administered 2015-12-05 – 2015-12-06 (×3): 5 mg via ORAL
  Filled 2015-12-05 (×3): qty 1

## 2015-12-05 NOTE — ED Notes (Signed)
1 Belonging bag placed in locker 30
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
## 2015-12-05 NOTE — Progress Notes (Signed)
Entered in d/c instructions Pllc, [facility] Schedule an appointment as soon as possible for a visit As needed This is your assigned Medicaid [place] access doctor If you prefer another contact DSS [phone] DSS assigned your doctor *You may receive a bill if you go to any family Dr not assigned to you [address]  [place] [address] [phone] Medicaid [place] Access Covered Patient [place]: [id number] [address] [place], [place] [address] [email or web address] Use this website to assist with understanding your coverage & to renew application As a Medicaid client you MUST contact DSS/SSI each time you change address, move to another [place] county or another state to keep your address updated  [place] Medicaid Transportation to Dr appts if you are have full Medicaid: [phone], [phone]
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
## 2015-12-05 NOTE — Progress Notes (Signed)
Discussed pt with K Resh, TTS, [place] and reviewed EPIC notes Pt with IDD unable to go to [facility] unit Awaiting Group home d/c

## 2015-12-05 NOTE — ED Notes (Addendum)
Pt is asleep but does awaken to her name called. Pt remains with a sitter due to SI. She does appear comfortable lying on her back. 8am - Phoned pharmacy to complete a med reconciliation on the pt. Pt voided all over herself and then said, "I wet myself. " pt was taken to the shower. She is able to follow directions and is redirectable. She often times uses one words to express her needs. 11:15am A urine specimen was obtained. Very foul smelling w/o sediment. Urine sent for a urine drug screen and for pregnancy. Pt keeps asking to speak to [facility]. Pt was assisted in the shower by a tech and pt c/o right breast pain. Phoned EDP. Per tech no discharge noted in the nipple area. 12:45p- Report to [employer]

## 2015-12-05 NOTE — ED Notes (Signed)
Telepsych in progress. 
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
## 2015-12-05 NOTE — BH Assessment (Signed)
Writer contacted pt RN Misty Stanley) to request cart placement for TTS Consult.

## 2015-12-06 DIAGNOSIS — F250 Schizoaffective disorder, bipolar type: Secondary | ICD-10-CM

## 2015-12-06 MED ORDER — HALOPERIDOL 5 MG PO TABS
10.0000 mg | ORAL_TABLET | Freq: Two times a day (BID) | ORAL | Status: DC
  Administered 2015-12-06 – 2015-12-09 (×6): 10 mg via ORAL
  Filled 2015-12-06 (×6): qty 2

## 2015-12-06 MED ORDER — ONDANSETRON 4 MG PO TBDP
4.0000 mg | ORAL_TABLET | Freq: Once | ORAL | Status: AC
  Administered 2015-12-06: 4 mg via ORAL
  Filled 2015-12-06: qty 1

## 2015-12-06 MED ORDER — PROMETHAZINE HCL 25 MG/ML IJ SOLN
25.0000 mg | Freq: Once | INTRAMUSCULAR | Status: AC
  Administered 2015-12-06: 25 mg via INTRAMUSCULAR
  Filled 2015-12-06 (×2): qty 1

## 2015-12-06 NOTE — Progress Notes (Signed)
*  CSW covering for [facility] ED CSW CSW received a phone call from Ulis Rias, [place] at [facility] who wanted to confirm that Patient's guardian would be picking Patient up from hospital and to inform CSW that the Home has began discharging Patient from home as Patient would not be returning when picked up by guardian. Ms. Katrinka Blazing clarified that Patient was not evicted but that her guardian would not be bringing her back to the home upon discharge from hospital. CSW called to discuss with RN CM at [facility]. CSW also attempted Patient's guardian to confirm information however was unsuccessful. HIPPA Compliant VM left.   Noe Gens, LCSW [facility] ED/ [facility] Clinical Social Worker [phone]
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
## 2015-12-06 NOTE — Progress Notes (Signed)
Attempting to locate pt information including IQ score/psychological testing, legal guardianship status, county of M[place], etc. In order to assist with obtaining information needed for disposition/referrals.  Called number for pt's sister, who chart suggests is pt guardian- [name] [phone]. Phone went straight to voicemail. Requested returned call.   Called [facility] and was told this pt is not within their catchment area. Called [facility] and spoke with access clinician Elnita Maxwell, who states pt's MCD originates in [place]. States they have listed as her guardian "Brett Albino- DSS- [phone]." States no other information in system for pt, no I/DD services listed, care coordination, etc.  Attempted reaching Brett Albino at number above. Reached [place] DSS and was told that Ms. Woolard no longer works there. Was transferred to supervisor of department [facility], left voicemail requesting returned call.   Called [facility] at [phone] to investigate whether pt receives [facility] services and if so, to seek [facility] Start Crisis assessment. Left voicemail for coordinator at number above.  Ilean Skill, MSW, LCSW Clinical Social Work, Disposition  12/06/2015 [phone]

## 2015-12-06 NOTE — ED Notes (Signed)
Pt offered a shower after vomiting on herself. Pt refused. Says 'I don't stink'.

## 2015-12-06 NOTE — ED Notes (Addendum)
Patient allowed to call sister [name]. Left message.
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
## 2015-12-07 ENCOUNTER — Emergency Department (HOSPITAL_COMMUNITY): Payer: Medicaid Other

## 2015-12-07 DIAGNOSIS — F250 Schizoaffective disorder, bipolar type: Secondary | ICD-10-CM | POA: Diagnosis not present

## 2015-12-07 DIAGNOSIS — F70 Mild intellectual disabilities: Secondary | ICD-10-CM | POA: Diagnosis not present

## 2015-12-07 LAB — INFLUENZA PANEL BY PCR (TYPE A & B)
H1N1FLUPCR: NOT DETECTED
INFLAPCR: NEGATIVE
INFLBPCR: POSITIVE — AB

## 2015-12-07 MED ORDER — OSELTAMIVIR PHOSPHATE 75 MG PO CAPS
75.0000 mg | ORAL_CAPSULE | Freq: Two times a day (BID) | ORAL | Status: DC
  Administered 2015-12-07 – 2015-12-09 (×4): 75 mg via ORAL
  Filled 2015-12-07 (×7): qty 1

## 2015-12-07 MED ORDER — ACETAMINOPHEN 325 MG PO TABS
650.0000 mg | ORAL_TABLET | ORAL | Status: DC | PRN
  Administered 2015-12-07 – 2015-12-09 (×4): 650 mg via ORAL
  Filled 2015-12-07 (×4): qty 2

## 2015-12-07 MED ORDER — OSELTAMIVIR PHOSPHATE 75 MG PO CAPS
75.0000 mg | ORAL_CAPSULE | Freq: Once | ORAL | Status: AC
  Administered 2015-12-07: 75 mg via ORAL
  Filled 2015-12-07: qty 1

## 2015-12-07 MED ORDER — IBUPROFEN 100 MG/5ML PO SUSP
400.0000 mg | Freq: Three times a day (TID) | ORAL | Status: DC | PRN
  Administered 2015-12-07 – 2015-12-09 (×3): 400 mg via ORAL
  Filled 2015-12-07 (×3): qty 20

## 2015-12-07 MED ORDER — ACETAMINOPHEN 325 MG PO TABS
650.0000 mg | ORAL_TABLET | Freq: Once | ORAL | Status: AC | PRN
  Administered 2015-12-07: 650 mg via ORAL
  Filled 2015-12-07: qty 2

## 2015-12-07 NOTE — Consult Note (Signed)
Psychiatric Specialty Exam: Physical Exam  ROS  Blood pressure 112/70, pulse 115, temperature 101.1 F (38.4 C), temperature source Oral, resp. rate 18, SpO2 93 %.There is no weight on file to calculate BMI.  General Appearance: Casual  Eye Contact::  Minimal  Speech:  Clear and Coherent and Pressured  Volume:  Increased  Mood:  Angry and Irritable  Affect:  Congruent  Thought Process:  Linear  Orientation:  Full (Time, Place, and Person)  Thought Content:  WDL  Suicidal Thoughts:  No  Homicidal Thoughts:  No  Memory:  Immediate;   Fair Recent;   Fair Remote;   Fair  Judgement:  Fair  Insight:  Shallow  Psychomotor Activity:  Psychomotor Retardation  Concentration:  Good  Recall:  NA  Fund of Knowledge:  Fair  Language:  Fair  Akathisia:  NA  Handed:  Right  AIMS (if indicated):     Assets:  Desire for Improvement  ADL's:  Intact  Cognition:  Impaired,  Moderate  Sleep:      Patient was seen this morning angry because she was informed that she might be going back to her group home.  Patient started yelling stating" I don't want to go back there"  Patient however is calm and cooperative.  She is compliant with her medications since her arrival.  Patient however refuses  ADLS because she does not want to go back to the group home.  Patient denies SI/HI/AVH.  She has been Psychiatrically cleared with SW involved in housing need.   Schizoaffective disorder, bipolar type (HCC)   Plan:  Cleared from Psychiatry  Dahlia Byes   PMHNP-BC  Patient seen face-to-face for this evaluation, case discussed with the treatment team and formulated treatment plan.Reviewed the information documented and agree with the treatment plan.   Gorman Safi,JANARDHAHA R. 12/07/2015 4:28 PM
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
## 2015-12-07 NOTE — ED Provider Notes (Signed)
11:00- called to room to evaluate patient for fever. Onset fever this morning. She complains of rhinorrhea and mild cough. She is alert and cooperative. She is being managed with observation for possible placement, from the transitional care unit.  Exam- obese, cooperative, calm. HEENT- clear rhinorrhea is present. No tonsillar hypertrophy or exudate. Neck- supple, nontender, no meningismus. Heart-tachycardia without murmur. Lungs-clear bilaterally without wheezes, Rales or rhonchi. Oxygen saturation is 100% on room air.  Assessment- clinically she has flu. Tamiflu ordered. Antipyretic ordered.   17:00- screen returned positive for influenza type B. Tamiflu 75 mg twice a day 9 doses, ordered.  Plan continue placement process.  Mancel Bale, MD 12/07/15 [phone]

## 2015-12-07 NOTE — ED Notes (Signed)
Pt asleep.  Sitter at bedside.  Will continue to monitor.

## 2015-12-07 NOTE — Progress Notes (Addendum)
Pt has high fevers and body aches. EDP aware and examined the pt. Pt ordered for a CXR and droplet precautions. Pt does not appear short of breath. Pt has drinks by the bedside. Pt remains a 1;1 . Pt taken for CXR with a mask on. Encouraged the pt to push po fluids. (11:30am )12noon -3 nasal swabs obtained and sent to the lab. Pt did return from CXR and tolerated well. She c/o body aches. Tylenol  given for fevers and body aches. Ice packs placed under both pts armpits. (12noon )Intake 600cc po so far. 12;20p pt OOB to the BR and voided.Given  of motrin po.; 1pm _pt eating lunch. Appears to be tolerating well. Pt stated, "I don't want to go back to that group home. Am I going back today?"Spoke with EDP concerning pts rapid heart rate.( 1:10pm ) Per EDP continue to push po. Pt ate lunch about 50 % - Pt took a shower and stated she felt better but felt dizzy. Pt back in the bed. 2:45pm. 3pm -Report to [facility]. Pt called her sister - [phone].

## 2015-12-08 MED ORDER — SALINE SPRAY 0.65 % NA SOLN
1.0000 | Freq: Once | NASAL | Status: AC
  Administered 2015-12-09: 1 via NASAL
  Filled 2015-12-08: qty 44

## 2015-12-08 MED ORDER — HYDROCOD POLST-CPM POLST ER 10-8 MG/5ML PO SUER: 5.0000 mL | Freq: Once | ORAL | Status: DC

## 2015-12-08 MED ORDER — HYDROCOD POLST-CPM POLST ER 10-8 MG/5ML PO SUER
5.0000 mL | Freq: Four times a day (QID) | ORAL | Status: DC | PRN
  Administered 2015-12-08: 5 mL via ORAL
  Administered 2015-12-09: 10 mL via ORAL
  Filled 2015-12-08: qty 5
  Filled 2015-12-08: qty 10

## 2015-12-08 MED ORDER — ONDANSETRON 4 MG PO TBDP
4.0000 mg | ORAL_TABLET | Freq: Four times a day (QID) | ORAL | Status: DC | PRN
  Administered 2015-12-08 – 2015-12-09 (×2): 4 mg via ORAL
  Filled 2015-12-08 (×2): qty 1

## 2015-12-08 NOTE — Clinical Social Work Note (Signed)
CSW faxed rescind IVC paperwork to magistrates office  .Elray Buba, LCSW [facility] Clinical Social Worker - Weekend Coverage cell #: [phone]

## 2015-12-08 NOTE — ED Notes (Signed)
Pt has been fixated on calling her sister since 1500. Pt has become more agitated and less redirectable. Pt was offered PRN zyprexa for this reason

## 2015-12-08 NOTE — ED Notes (Signed)
Meal tray given to pt.

## 2015-12-08 NOTE — Clinical Social Work Note (Signed)
CSW spoke with charge nurse who stated pt has flu and not ready for discharge until Monday or Tuesday.  CSW spoke with pt's group home director [address] and provided information regarding pt and available group homes throughout [place]. Pt may be better suited in a mental health group home versus DD and guardian and current group home may research these alternatives.   CSW faxed [phone]  group home lists of other mental health and DD group homes to current group home [address] so  that she and pt's guardian can research to find an alterative placement once pt is released from hospital   .Elray Buba, LCSW [facility] Clinical Social Worker - Weekend Coverage cell #: [phone]

## 2015-12-08 NOTE — ED Notes (Addendum)
Pt is asleep but does not appear in any distress. Pt given a container of drinks and instructed to push po fluids. She stated she is very tired. Pt is resting with the covers over her head. Pt was medicated with tylenol and motrin to keep her fevers down. Pt remains cooperative. (8:10am ) Phoned EDP at 8:50pm Pt c/o nausea and has a cough w/o production. Pts intake so far has been 480cc since 7:45pm. Pt given zofran for nausea and tussenix for her cough. Pt is OOB to the BR. Pt requested to speak to her sister. Phoned her sister and left a voice mail (9:50pm ) pt is sound asleep with regular respirations. (10:30pm ) Total po intake from 7:45p-11pm -600cc. Pt has voided in the BR times two during this shift. Pt remains very cooperative and does go to the BR . She has not been incontinent today at all. (10:45pm) pt is awake watching TV and drinking sprite. 12mn,. She requested some string cheese and was given a coloring book and crayons. Pt keeps asking to call her sister and was told that it is to late. She remains cooperative. 12;30am - Report to nurse Hessie Diener. Pt was transported to room 9 in the main ER.

## 2015-12-08 NOTE — ED Notes (Addendum)
Pt ambulated to BR and back to room w/o assistance with sitter

## 2015-12-08 NOTE — ED Notes (Signed)
Pt ambulates to BR and back to room w/o assistance but accompanied by sitter

## 2015-12-08 NOTE — ED Notes (Signed)
SW talking to pt about plans to d/c to previous facility or find a new one

## 2015-12-09 ENCOUNTER — Telehealth: Payer: Self-pay | Admitting: *Deleted

## 2015-12-09 MED ORDER — OSELTAMIVIR PHOSPHATE 75 MG PO CAPS
75.0000 mg | ORAL_CAPSULE | Freq: Two times a day (BID) | ORAL | Status: AC
Start: 2015-12-12 — End: 2015-12-16

## 2015-12-09 MED ORDER — ZIPRASIDONE MESYLATE 20 MG IM SOLR: 20.0000 mg | Freq: Once | INTRAMUSCULAR | Status: DC

## 2015-12-09 NOTE — ED Notes (Signed)
Bed: WA09 Expected date:  Expected time:  Means of arrival:  Comments: TCU pt

## 2015-12-09 NOTE — ED Notes (Signed)
Bed: RU04 Expected date: 12/09/15 Expected time:  Means of arrival:  Comments: ROOM 9

## 2015-12-09 NOTE — ED Notes (Signed)
Patient with blankets over her head. This RN called out her name x3 before removing blankets. Pt then aroused and awake. Pt will not give a number to her pain when asked but shakes head no.

## 2015-12-09 NOTE — Discharge Instructions (Signed)
Schizoaffective Disorder Schizoaffective disorder (ScAD) is a mental illness. It causes symptoms that are a mixture of schizophrenia (a psychotic disorder) and an affective (mood) disorder. The schizophrenic symptoms may include delusions, hallucinations, or odd behavior. The mood symptoms may be similar to major depression or bipolar disorder. ScAD may interfere with personal relationships or normal daily activities. People with ScAD are at increased risk for job loss, social isolation,physical health problems, anxiety and substance use disorders, and suicide. ScAD usually occurs in cycles. Periods of severe symptoms are followed by periods of less severe symptoms or improvement. The illness affects men and women equally but usually appears at an earlier age (teenage or early adult years) in men. People who have family members with schizophrenia, bipolar disorder, or ScAD are at higher risk of developing ScAD. SYMPTOMS  At any one time, people with ScAD may have psychotic symptoms only or both psychotic and mood symptoms. The psychotic symptoms include one or more of the following:  Hearing, seeing, or feeling things that are not there (hallucinations).   Having fixed, false beliefs (delusions). The delusions usually are of being attacked, harassed, cheated, persecuted, or conspired against (paranoid delusions).  Speaking in a way that makes no sense to others (disorganized speech). The psychotic symptoms of ScAD may also include confusing or odd behavior or any of the negative symptoms of schizophrenia. These include loss of motivation for normal daily activities, such as bathing or grooming, withdrawal from other people, and lack of emotions.  The mood symptoms of ScAD occur more often than not. They resemble major depressive disorder or bipolar mania. Symptoms of major depression include depressed mood and four or more of the following:  Loss of interest in usually pleasurable activities  (anhedonia).  Sleeping more or less than normal.  Feeling worthless or excessively guilty.  Lack of energy or motivation.  Trouble concentrating.  Eating more or less than usual.  Thinking a lot about death or suicide. Symptoms of bipolar mania include abnormally elevated or irritable mood and increased energy or activity, plus three or more of the following:   More confidence than normal or feeling that you are able to do anything (grandiosity).  Feeling rested with less sleep than normal.   Being easily distracted.   Talking more than usual or feeling pressured to keep talking.   Feeling that your thoughts are racing.  Engaging in high-risk activities such as buying sprees or foolish business decisions. DIAGNOSIS  ScAD is diagnosed through an assessment by your health care provider. Your health care provider will observe and ask questions about your thoughts, behavior, mood, and ability to function in daily life. Your health care provider may also ask questions about your medical history and use of drugs, including prescription medicines. Your health care provider may also order blood tests and imaging exams. Certain medical conditions and substances can cause symptoms that resemble ScAD. Your health care provider may refer you to a mental health specialist for evaluation.  ScAD is divided into two types. The depressive type is diagnosed if your mood symptoms are limited to major depression. The bipolar type is diagnosed if your mood symptoms are manic or a mixture of manic and depressive symptoms TREATMENT  ScAD is usually a lifelong illness. Long-term treatment is necessary. The following treatments are available:  Medicine. Different types of medicine are used to treat ScAD. The exact combination depends on the type and severity of your symptoms. Antipsychotic medicine is used to control psychotic symptomssuch as delusions, paranoia,  and hallucinations. Mood stabilizers can  even the highs and lows of bipolar manic mood swings. Antidepressant medicines are used to treat major depressive symptoms.  Counseling or talk therapy. Individual, group, or family counseling may be helpful in providing education, support, and guidance. Many people with ScAD also benefit from social skills and job skills (vocational) training. A combination of medicine and counseling is usually best for managing the disorder over time. A procedure in which electricity is applied to the brain through the scalp (electroconvulsive therapy) may be used to treat people with severe manic symptoms that do not respond to medicine and counseling. HOME CARE INSTRUCTIONS   Take all your medicine as prescribed.  Check with your health care provider before starting new prescription or over-the-counter medicines.  Keep all follow up appointments with your health care provider. SEEK MEDICAL CARE IF:   If you are not able to take your medicines as prescribed.  If your symptoms get worse. SEEK IMMEDIATE MEDICAL CARE IF:   You have serious thoughts about hurting yourself or others.   This information is not intended to replace advice given to you by your health care provider. Make sure you discuss any questions you have with your health care provider.   Document Released: 02/15/2007 Document Revised: 10/26/2014 Document Reviewed: 05/19/2013 Elsevier Interactive Patient Education 2016 Elsevier Inc.  Influenza, Adult Influenza ("the flu") is a viral infection of the respiratory tract. It occurs more often in winter months because people spend more time in close contact with one another. Influenza can make you feel very sick. Influenza easily spreads from person to person (contagious). CAUSES  Influenza is caused by a virus that infects the respiratory tract. You can catch the virus by breathing in droplets from an infected person's cough or sneeze. You can also catch the virus by touching something that  was recently contaminated with the virus and then touching your mouth, nose, or eyes. RISKS AND COMPLICATIONS You may be at risk for a more severe case of influenza if you smoke cigarettes, have diabetes, have chronic heart disease (such as heart failure) or lung disease (such as asthma), or if you have a weakened immune system. Elderly people and pregnant women are also at risk for more serious infections. The most common problem of influenza is a lung infection (pneumonia). Sometimes, this problem can require emergency medical care and may be life threatening. SIGNS AND SYMPTOMS  Symptoms typically last 4 to 10 days and may include:  Fever.  Chills.  Headache, body aches, and muscle aches.  Sore throat.  Chest discomfort and cough.  Poor appetite.  Weakness or feeling tired.  Dizziness.  Nausea or vomiting. DIAGNOSIS  Diagnosis of influenza is often made based on your history and a physical exam. A nose or throat swab test can be done to confirm the diagnosis. TREATMENT  In mild cases, influenza goes away on its own. Treatment is directed at relieving symptoms. For more severe cases, your health care provider may prescribe antiviral medicines to shorten the sickness. Antibiotic medicines are not effective because the infection is caused by a virus, not by bacteria. HOME CARE INSTRUCTIONS  Take medicines only as directed by your health care provider.  Use a cool mist humidifier to make breathing easier.  Get plenty of rest until your temperature returns to normal. This usually takes 3 to 4 days.  Drink enough fluid to keep your urine clear or pale yellow.  Cover yourmouth and nosewhen coughing or sneezing,and wash your  handswellto prevent thevirusfrom spreading.  Stay homefromwork orschool untilthe fever is gonefor at least 48full day. PREVENTION  An annual influenza vaccination (flu shot) is the best way to avoid getting influenza. An annual flu shot is now  routinely recommended for all adults in the [place] SEEK MEDICAL CARE IF:  You experiencechest pain, yourcough worsens,or you producemore mucus.  Youhave nausea,vomiting, ordiarrhea.  Your fever returns or gets worse. SEEK IMMEDIATE MEDICAL CARE IF:  You havetrouble breathing, you become short of breath,or your skin ornails becomebluish.  You have severe painor stiffnessin the neck.  You develop a sudden headache, or pain in the face or ear.  You have nausea or vomiting that you cannot control. MAKE SURE YOU:   Understand these instructions.  Will watch your condition.  Will get help right away if you are not doing well or get worse.   This information is not intended to replace advice given to you by your health care provider. Make sure you discuss any questions you have with your health care provider.   Document Released: 10/02/2000 Document Revised: 10/26/2014 Document Reviewed: 01/04/2012 Elsevier Interactive Patient Education [employer]

## 2015-12-09 NOTE — ED Notes (Signed)
Kim with SW and Group Home worker with patient. Pt is agreeable to go and is calm.

## 2015-12-09 NOTE — ED Notes (Signed)
Pt getting dressed.

## 2015-12-09 NOTE — ED Notes (Signed)
Pt standing at nurses station dressed. Stating that she does not want to go. Discussing with [employer] on staff. Catha Nottingham NP and Social worker called out to [employer]. Pt is getting upset. Measures are being taken to deescalate patient. Staff is now present 1310.
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
## 2015-12-09 NOTE — ED Provider Notes (Signed)
Pt vital signs stable, cleared by psychiatry, cleared to return to Group Home. Will continue tamiflu for influenza B for 4 more days.   Alvira Monday, MD 12/09/15 2230

## 2015-12-09 NOTE — ED Notes (Signed)
Sloan Leiter from Group home to take patient home.

## 2015-12-09 NOTE — Progress Notes (Addendum)
CSW staffed with EDP 9:45am. CSW was informed by EDP that patient was medically cleared for discharged. CSW staffed with Psychiatry Team 10:10am. CSW was informed patient has been cleared by psychiatry for discharge.  CSW spoke with Elmer Picker, Director, [facility], [phone] to inform her that patient has been cleared medically and psychiatrically and that she was ready for discharge and pickup from the ED. She stated she spoke with the social worker over the weekend and she was made aware that patient did not want to return. CSW informed Director that patient cannot remain in the emergency department now that she has been cleared. Director reports she will speak with her Qualified Professional at her agency and call CSW back.  CSW received telephone call from Ulis Rias, [place] from [facility]. She reports they completed an immediate discharge form on patient last week with the law enforcement officer being aware and with patient's Guardian, [name] (sister) via telephone call. CSW informed QP that the Director did not speak of an immediate discharge notice being completed last week when CSW spoke with Director by phone.   CSW attempted a telephone call to Rondel Oh [phone]. CSW left a voicemail for return call with name and contact number. CSW attempted another telephone call.  CSW staffed with Asst. Social Work Interior and spatial designer. CSW informed to call Director of the group home back again regarding picking up the patient and to ascertain the address for the Guardian.     CSW spoke with [facility], from the Group Home at 11:40am. She stated she would be here to pick up patient around forty-five minutes to an hour. Ms. Estanislado Spire arrived to pick up patient and patient did not want to leave with Director. Law enforcement was speaking with patient when NP and CSW received the call about patient not wanting to leave and go back to the group home. Nurse CM came  and spoke with patient in patient's room. CSW called and spoke with patient's Guardian, [name]. Guardian stated she could not come and pick up patient and she did not have anywhere for patient to stay. Guardian stated patient cannot go and stay with their mother. Guardian stated her husband did not get off of work until 9:00pm tonight, she was at work also, and she did not have anyone that could keep patient. CSW informed Guardian that they could pick patient up on tonight. CSW asked Guardian if she was the court appointed Guardian for patient and she replied yes. CSW informed Guardian if patient did not go with the Director from the group home, she would need to come and pick patient up from the emergency department. While CSW was on the telephone speaking with Guardian, patient agreed to go back to the group home. CSW informed patient's guardian that patient wanted to speak with her by phone. Guardian spoke with patient by phone. Nurse CM informed CSW of patient's concerns and CSW informed Asst. Social Work Interior and spatial designer about patient's concerns. Nurse CM walked with patient out after being discharged. Asst. Social Work Interior and spatial designer informed that patient went with group home Interior and spatial designer.  Elenore Paddy [phone] ED CSW 12/09/2015 3:05 PM       Elenore Paddy [phone] ED CSW 12/09/2015

## 2015-12-14 ENCOUNTER — Emergency Department (HOSPITAL_COMMUNITY)
Admission: EM | Admit: 2015-12-14 | Discharge: 2015-12-14 | Disposition: A | Discharge status: Home / Self Care | Payer: MEDICAID

## 2015-12-25 ENCOUNTER — Emergency Department (HOSPITAL_COMMUNITY)
Admission: EM | Admit: 2015-12-25 | Discharge: 2015-12-28 | Disposition: A | Discharge status: Home / Self Care | Payer: Medicaid Other | Attending: Emergency Medicine | Admitting: Emergency Medicine

## 2015-12-25 ENCOUNTER — Encounter (HOSPITAL_COMMUNITY): Payer: Self-pay

## 2015-12-25 DIAGNOSIS — Z3202 Encounter for pregnancy test, result negative: Secondary | ICD-10-CM | POA: Diagnosis not present

## 2015-12-25 DIAGNOSIS — F419 Anxiety disorder, unspecified: Secondary | ICD-10-CM | POA: Insufficient documentation

## 2015-12-25 DIAGNOSIS — Z862 Personal history of diseases of the blood and blood-forming organs and certain disorders involving the immune mechanism: Secondary | ICD-10-CM | POA: Insufficient documentation

## 2015-12-25 DIAGNOSIS — F250 Schizoaffective disorder, bipolar type: Secondary | ICD-10-CM | POA: Diagnosis not present

## 2015-12-25 DIAGNOSIS — J45909 Unspecified asthma, uncomplicated: Secondary | ICD-10-CM | POA: Diagnosis not present

## 2015-12-25 DIAGNOSIS — R45850 Homicidal ideations: Secondary | ICD-10-CM | POA: Insufficient documentation

## 2015-12-25 DIAGNOSIS — Z79899 Other long term (current) drug therapy: Secondary | ICD-10-CM | POA: Insufficient documentation

## 2015-12-25 DIAGNOSIS — E669 Obesity, unspecified: Secondary | ICD-10-CM | POA: Insufficient documentation

## 2015-12-25 DIAGNOSIS — F911 Conduct disorder, childhood-onset type: Secondary | ICD-10-CM | POA: Diagnosis present

## 2015-12-25 DIAGNOSIS — R45851 Suicidal ideations: Secondary | ICD-10-CM | POA: Insufficient documentation

## 2015-12-25 LAB — COMPREHENSIVE METABOLIC PANEL
ALK PHOS: 59 U/L (ref 38–126)
ALT: 25 U/L (ref 14–54)
ANION GAP: 10 (ref 5–15)
AST: 25 U/L (ref 15–41)
Albumin: 3.8 g/dL (ref 3.5–5.0)
BILIRUBIN TOTAL: 0.3 mg/dL (ref 0.3–1.2)
BUN: 8 mg/dL (ref 6–20)
CALCIUM: 8.8 mg/dL — AB (ref 8.9–10.3)
CO2: 19 mmol/L — ABNORMAL LOW (ref 22–32)
Chloride: 112 mmol/L — ABNORMAL HIGH (ref 101–111)
Creatinine, Ser: 0.57 mg/dL (ref 0.44–1.00)
Glucose, Bld: 88 mg/dL (ref 65–99)
Potassium: 4 mmol/L (ref 3.5–5.1)
Sodium: 141 mmol/L (ref 135–145)
TOTAL PROTEIN: 7 g/dL (ref 6.5–8.1)

## 2015-12-25 LAB — CBC
HEMATOCRIT: 32.4 % — AB (ref 36.0–46.0)
Hemoglobin: 10.5 g/dL — ABNORMAL LOW (ref 12.0–15.0)
MCH: 23.9 pg — ABNORMAL LOW (ref 26.0–34.0)
MCHC: 32.4 g/dL (ref 30.0–36.0)
MCV: 73.8 fL — ABNORMAL LOW (ref 78.0–100.0)
PLATELETS: DECREASED 10*3/uL (ref 150–400)
RBC: 4.39 MIL/uL (ref 3.87–5.11)
RDW: 18.4 % — AB (ref 11.5–15.5)
WBC: 8.9 10*3/uL (ref 4.0–10.5)

## 2015-12-25 LAB — ACETAMINOPHEN LEVEL

## 2015-12-25 LAB — I-STAT BETA HCG BLOOD, ED (MC, WL, AP ONLY): I-stat hCG, quantitative: <5 m[IU]/mL (ref <5)

## 2015-12-25 LAB — RAPID URINE DRUG SCREEN, HOSP PERFORMED
Amphetamines: NOT DETECTED
BARBITURATES: NOT DETECTED
Benzodiazepines: NOT DETECTED
COCAINE: NOT DETECTED
OPIATES: NOT DETECTED
Tetrahydrocannabinol: NOT DETECTED

## 2015-12-25 LAB — ETHANOL

## 2015-12-25 LAB — SALICYLATE LEVEL

## 2015-12-25 MED ORDER — ALUM & MAG HYDROXIDE-SIMETH 200-200-20 MG/5ML PO SUSP
30.0000 mL | ORAL | Status: DC | PRN
Start: 2015-12-25 — End: 2015-12-28

## 2015-12-25 MED ORDER — ACETAMINOPHEN 325 MG PO TABS: 650.0000 mg | ORAL_TABLET | ORAL | Status: DC | PRN

## 2015-12-25 MED ORDER — ONDANSETRON HCL 4 MG PO TABS: 4.0000 mg | ORAL_TABLET | Freq: Three times a day (TID) | ORAL | Status: DC | PRN

## 2015-12-25 MED ORDER — NICOTINE 21 MG/24HR TD PT24
21.0000 mg | MEDICATED_PATCH | Freq: Every day | TRANSDERMAL | Status: DC | PRN
  Administered 2015-12-28: 21 mg via TRANSDERMAL
  Filled 2015-12-25: qty 1

## 2015-12-25 MED ORDER — IBUPROFEN 200 MG PO TABS
600.0000 mg | ORAL_TABLET | Freq: Three times a day (TID) | ORAL | Status: DC | PRN
  Administered 2015-12-27: 600 mg via ORAL
  Filled 2015-12-25: qty 3

## 2015-12-25 MED ORDER — ZOLPIDEM TARTRATE 5 MG PO TABS: 5.0000 mg | ORAL_TABLET | Freq: Every evening | ORAL | Status: DC | PRN

## 2015-12-25 NOTE — ED Provider Notes (Signed)
CSN: [id number]     Arrival date & time 12/25/15  1425 History   First MD Initiated Contact with Patient 12/25/15 1527     Chief Complaint  Patient presents with  . Aggressive Behavior     (Consider location/radiation/quality/duration/timing/severity/associated sxs/prior Treatment) HPI   Theresa [name] is a 25 y.o. femalePresents for evaluation of aggressive behavior with homicidal and suicidal ideation. This occurred while she was at daycare today.Patient told her caregiver that she hears voices that tell her to do these things.She has chronic auditory hallucinations. She has had intermittent episodes of suicidal ideation in the past. She saw a new psychiatrist, this morning for "medication management. No other recent issues.There are no other no modifying factors.   Past Medical History  Diagnosis Date  . Anxiety   . Sickle cell anemia (HCC)   . Schizoaffective disorder (HCC)   . Mental retardation   . Asthma   . Speech and language deficits    History reviewed. No pertinent past surgical history. History reviewed. No pertinent family history. Social History  Substance Use Topics  . Smoking status: Never Smoker   . Smokeless tobacco: None  . Alcohol Use: No   OB History    No data available     Review of Systems  All other systems reviewed and are negative.     Allergies  Review of patient's allergies indicates no known allergies.  Home Medications   Prior to Admission medications   Medication Sig Start Date End Date Taking? Authorizing Provider  busPIRone (BUSPAR) 10 MG tablet Take 10 mg by mouth 3 (three) times daily.   Yes Historical Provider, MD  divalproex (DEPAKOTE ER) 500 MG 24 hr tablet Take 1,000 mg by mouth at bedtime.   Yes Historical Provider, MD  haloperidol (HALDOL) 5 MG tablet Take 5 mg by mouth. Take 5mg s every morning and may take 5mg s twice daily as needed   Yes Historical Provider, MD  lithium carbonate (ESKALITH) 450 MG CR tablet Take 450 mg  by mouth at bedtime.   Yes Historical Provider, MD  LORazepam (ATIVAN) 2 MG tablet Take 1 tablet (2 mg total) by mouth every 6 (six) hours as needed (Agitation). 06/10/15  Yes Audery AmelJohn T Clapacs, MD  naproxen (NAPROSYN) 500 MG tablet Take 500 mg by mouth 2 (two) times daily as needed for mild pain.   Yes Historical Provider, MD  promethazine (PHENERGAN) 25 MG tablet Take 25 mg by mouth every 12 (twelve) hours as needed for nausea or vomiting.   Yes Historical Provider, MD  QUEtiapine (SEROQUEL) 100 MG tablet Take 100 mg by mouth at bedtime.   Yes Historical Provider, MD  QUEtiapine (SEROQUEL) 25 MG tablet Take 25 mg by mouth 2 (two) times daily.   Yes Historical Provider, MD  risperiDONE (RISPERDAL) 2 MG tablet Take 1 tablet (2 mg total) by mouth 2 (two) times daily. 06/10/15 07/10/16 Yes Jennye MoccasinBrian S Quigley, MD  traZODone (DESYREL) 100 MG tablet Take 100 mg by mouth at bedtime.   Yes Historical Provider, MD   BP 120/101 mmHg  Pulse 86  Temp(Src) 98.1 F (36.7 C) (Oral)  Resp 15  SpO2 100% Physical Exam  Constitutional: She is oriented to person, place, and time. She appears well-developed.  obese  HENT:  Head: Normocephalic and atraumatic.  Right Ear: External ear normal.  Left Ear: External ear normal.  Eyes: Conjunctivae and EOM are normal. Pupils are equal, round, and reactive to light.  Neck: Normal range of motion and  phonation normal. Neck supple.  Cardiovascular: Normal rate and regular rhythm.   Pulmonary/Chest: Effort normal. She exhibits no bony tenderness.  Musculoskeletal: Normal range of motion.  Neurological: She is alert and oriented to person, place, and time. No cranial nerve deficit or sensory deficit. She exhibits normal muscle tone. Coordination normal.  Skin: Skin is warm, dry and intact.  Psychiatric:  Withdrawn, depressed  Nursing note and vitals reviewed.   ED Course  Procedures (including critical care time)  Medications - No data to display  Patient Vitals for  the past 24 hrs:  BP Temp Temp src Pulse Resp SpO2  12/25/15 1454 (!) 120/101 mmHg 98.1 F (36.7 C) Oral 86 15 100 %   15:40- TTS consult  Patient medically cleared for treatment by psychiatry services '  Labs Review Labs Reviewed  CBC - Abnormal; Notable for the following:    Hemoglobin 10.5 (*)    HCT 32.4 (*)    MCV 73.8 (*)    MCH 23.9 (*)    RDW 18.4 (*)    All other components within normal limits  COMPREHENSIVE METABOLIC PANEL  ETHANOL  SALICYLATE LEVEL  ACETAMINOPHEN LEVEL  URINE RAPID DRUG SCREEN, HOSP PERFORMED  I-STAT BETA HCG BLOOD, ED ([facility], [facility], [facility] ONLY)    Imaging Review No results found. I have personally reviewed and evaluated these images and lab results as part of my medical decision-making.   EKG Interpretation None      MDM   Final diagnoses:  Schizoaffective disorder, bipolar type [facility])    Psychiatric disorder . Appears to be at baseline. However, behavioral outbursts, are difficult to assess.Psychiatry, and TTS consult to assist with evaluation and disposition  Nursing Notes Reviewed/ Care Coordinated, and agree without changes. Applicable Imaging Reviewed.  Interpretation of Laboratory Data incorporated into ED treatment  Plan- as per TTS in conjunction with oncoming provider team    Mancel Bale, MD 12/28/15 2140

## 2015-12-25 NOTE — ED Notes (Signed)
Pt went to psych MD this morning and was cleared "ok".  Group home director took pt to her day program.  Called 2 hours ago to come back b/c pt was getting aggressive with staff and other residents.  Pt is hearing voices to hurt others.  Pt hearing voices telling her to hurt self and others.
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
## 2015-12-26 DIAGNOSIS — F250 Schizoaffective disorder, bipolar type: Secondary | ICD-10-CM

## 2015-12-26 MED ORDER — BUSPIRONE HCL 10 MG PO TABS
15.0000 mg | ORAL_TABLET | Freq: Two times a day (BID) | ORAL | Status: DC
Start: 2015-12-26 — End: 2015-12-28
  Administered 2015-12-26 – 2015-12-28 (×5): 15 mg via ORAL
  Filled 2015-12-26 (×5): qty 2

## 2015-12-26 MED ORDER — DIVALPROEX SODIUM ER 500 MG PO TB24
1000.0000 mg | ORAL_TABLET | Freq: Every day | ORAL | Status: DC
  Administered 2015-12-26 – 2015-12-27 (×2): 1000 mg via ORAL
  Filled 2015-12-26 (×2): qty 2

## 2015-12-26 MED ORDER — HALOPERIDOL LACTATE 5 MG/ML IJ SOLN: 10.0000 mg | Freq: Once | INTRAMUSCULAR | Status: DC

## 2015-12-26 MED ORDER — BUSPIRONE HCL 10 MG PO TABS: 10.0000 mg | ORAL_TABLET | Freq: Three times a day (TID) | ORAL | Status: DC

## 2015-12-26 MED ORDER — HALOPERIDOL 5 MG PO TABS
5.0000 mg | ORAL_TABLET | Freq: Two times a day (BID) | ORAL | Status: DC
  Administered 2015-12-26 – 2015-12-28 (×5): 5 mg via ORAL
  Filled 2015-12-26 (×5): qty 1

## 2015-12-26 MED ORDER — QUETIAPINE FUMARATE 100 MG PO TABS: 100.0000 mg | ORAL_TABLET | Freq: Every day | ORAL | Status: DC

## 2015-12-26 MED ORDER — TRAZODONE HCL 100 MG PO TABS
100.0000 mg | ORAL_TABLET | Freq: Every day | ORAL | Status: DC
  Administered 2015-12-26 – 2015-12-27 (×2): 100 mg via ORAL
  Filled 2015-12-26 (×2): qty 1

## 2015-12-26 MED ORDER — BENZTROPINE MESYLATE 1 MG PO TABS
1.0000 mg | ORAL_TABLET | Freq: Two times a day (BID) | ORAL | Status: DC
  Administered 2015-12-26 – 2015-12-28 (×5): 1 mg via ORAL
  Filled 2015-12-26 (×5): qty 1

## 2015-12-26 NOTE — Progress Notes (Addendum)
CSW spoke with Theresa [name], [facility] [phone] regarding sending a referral for patient. CSW obtained fax# [phone] and faxed referral to Adult Admissions.  CSW attempted a telephone call to Theresa [name] [phone], Director of the group home to obtain any information on psychometric testing or any legal documents. Name and contact number was left for return call.  CSW received a return call from Theresa [name], Interior and spatial designerDirector of the group home. She stated she does not have any psychometric testing on patient as patient does not have a birth certificate. She stated she was attempting to assist patient with an ID. She stated she will fax guardianship papers and CSW provided the fax# [phone].   Theresa [name], LCSWA [phone] ED CSW 12/26/2015 1:17 PM

## 2015-12-26 NOTE — ED Notes (Signed)
Pt has in belonging bag:  Pink and grey shoes, blue jeans, blue t-shirt, white socks, and blue rubber bracelet in locker 27

## 2015-12-26 NOTE — BH Assessment (Signed)
[facility] Assessment Progress Note  Per Thedore MinsMojeed Akintayo, MD, this pt requires psychiatric hospitalization.  [facility].  Decision is pending.   Doylene Canninghomas Brucha Ahlquist, MA  Triage Specialist  [phone]

## 2015-12-26 NOTE — ED Provider Notes (Signed)
I was contacted by nursing staff to treat the patient for increased auditory hallucinations and agitation. She reportedly was responding to voices and complaining of severe anxiety because of this. An order for Haldol 10 mg IM was written. I evaluated the patient and at this time she is quiet and sleeping without agitation. She is due for her scheduled medications within the next hour to include oral Haldol. At this time, the IM dose is DC'd because the patient is calm and resting.  Arby BarretteMarcy Keeghan Bialy, MD 12/26/15 2010
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
## 2015-12-26 NOTE — ED Notes (Signed)
Informed provider that pt is agitated and "hearing voices" telling her to hurt herself.

## 2015-12-26 NOTE — ED Notes (Signed)
Patient is guarded and paranoid. States "yes" when asked if she is hearing voices.  Snack given.

## 2015-12-27 DIAGNOSIS — F250 Schizoaffective disorder, bipolar type: Secondary | ICD-10-CM | POA: Diagnosis not present

## 2015-12-27 NOTE — Progress Notes (Signed)
ED Cm called and left a voice message for pt's sister Herma CarsonChristina Sims at  [phone] CM informed Trula OreChristina the pt had been evaluated, is now ready for d/c home with her, Trula OreChristina and staff are waiting for her, Trula OreChristina to pick up the pt when she gets off work. Cm left [facility] and [facility] ED main numbers for Christina to call if questions

## 2015-12-27 NOTE — Progress Notes (Signed)
Entered in d/c instructions  P[facility], [facility] Schedule an appointment as soon as possible for a visit This is your assigned Medicaid [place] access doctor If you prefer another contact DSS [phone] DSS assigned your doctor *You may receive a bill if you go to any family Dr not assigned to you [address] [place] [id number] [phone] Medicaid [place] Access Covered Patient [place]: [phone] [address] [place], [place] [id number] [email or web address]://dma.ncdhhs.gov/ Use this website to assist with understanding your coverage & to renew application As a Medicaid client you MUST contact DSS/SSI each time you change address, move to another [place] county or another state to keep your address updated  [place] Transportation to Dr appts if you are have full Medicaid: [phone], [phone]

## 2015-12-27 NOTE — ED Notes (Signed)
Gave pt a sprite. 

## 2015-12-27 NOTE — ED Notes (Signed)
Pt made 2nd phone call to sister, pt also was informed that this was her 2nd phone call and that she is only allowed 3 phone call per day.

## 2015-12-27 NOTE — ED Notes (Signed)
Pt made phone call to sister

## 2015-12-27 NOTE — BHH Suicide Risk Assessment (Addendum)
Suicide Risk Assessment  Discharge Assessment   [facility]   Principal Problem: Schizoaffective disorder, bipolar type [facility](HCC) Discharge Diagnoses:  Patient Active Problem List   Diagnosis Date Noted  . Schizoaffective disorder, bipolar type (HCC) [F25.0] 12/05/2015    Priority: High  . Bipolar disorder (HCC) [F31.9] 01/26/2015    Priority: High  . Schizophrenia (HCC) [F20.9] 03/13/2015  . 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 Asthma [J45.909] 03/13/2015    Total Time spent with patient: 30 minutes  Strength & Muscle Tone: within normal limits Gait & Station: normal Patient leans: N/A  Psychiatric Specialty Exam: Review of Systems  Unable to perform ROS: mental acuity    Blood pressure 96/60, pulse 69, temperature 98 F (36.7 C), temperature source Oral, resp. rate 17, SpO2 100 %.There is no weight on file to calculate BMI.  General Appearance: Casual  Eye Contact::  Minimal  Speech:  Slow  Volume:  Decreased  Mood:  Euthymic  Affect:  Congruent  Thought Process:  Linear  Orientation:  Person, Place  Thought Content:  WDL  Suicidal Thoughts:  No; Denies; no plan  Homicidal Thoughts:  No  Memory:  Immediate: Fair Recent: Fair Remote: Fair  Judgement:  Fair  Insight:  Limited  Psychomotor Activity:  Psychomotor Retardation  Concentration:  Fair  Recall:  Fair  Fund of Knowledge:Poor  Language: Poor  Akathisia:  No  Handed:  Right  AIMS (if indicated):     Assets:  Desire for Improvement  ADL's:  Impaired  Cognition: Impaired,  Mild  Sleep:       Mental Status Per Nursing Assessment::   On Admission: Auditory and Visual Hallucinations  Demographic Factors:  Adolescent or young adult and Low socioeconomic status  Loss Factors: NA  Historical Factors: Impulsivity  Risk Reduction Factors:   Living with another person, especially a relative, Positive social support and Positive therapeutic  relationship  Continued Clinical Symptoms:  None  Cognitive Features That Contribute To Risk:  None    Suicide Risk:  Minimal: No identifiable suicidal ideation.  Patients presenting with no risk factors but with morbid ruminations; may be classified as minimal risk based on the severity of the depressive symptoms    Plan Of Care/Follow-up recommendations:  Activity:  As Tolerated Diet:  Heart Healthy  Nanine MeansLORD, JAMISON, NP  12/27/2015, 1:57 PM

## 2015-12-27 NOTE — ED Notes (Signed)
1am to 7am Nursing Update:  Pt slept entire night. No issues.
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
## 2015-12-27 NOTE — ED Notes (Signed)
Call from sister and brother-in-law stating that could not make the round trip for patient pick up this evening.  Stated that they "would try" to come tomorrow, but it is a hardship.  Sister asked for this facility to find a new group home. Updated pt on status.  Report to charge nurse that d/c would not occur this evening.

## 2015-12-27 NOTE — Progress Notes (Addendum)
A mobile phone text was sent to sister [phone]) " Trula OreChristina, Talbert ForestShirley has been evaluated, she is ready for discharge and he staff is waiting for you to pick her up when you get off work"

## 2015-12-28 NOTE — Clinical Social Work Note (Signed)
CS[facility] number and left a message with details about pt's group home [facility] not picking up pt.  CSW left phone number for call back as well.  CSW also left a message for Ardath SaxDoug [name] who is liaison for the state when there is abandonment concerns. A detailed message was left with group home information and phone call of CSW asking for a return call.     Theresa [name].Theresa Armel, LCSW [facility] Clinical Social Worker - Weekend Coverage cell #: [phone]

## 2015-12-28 NOTE — ED Notes (Signed)
Pt is all upset about having to go back to the group home.  Pt reports "they don't want me there".  Pt insists that her sister is coming to get her.

## 2015-12-28 NOTE — BHH Counselor (Signed)
Contacted patients guardian Vennie HomansBarbara Rhodes [phone] to inform her that patient is ready for discharge. She states that no one called her to let her know that the patient was prepared for discharge. She states that she spoke with Rene Kocheregina, [place] and states that Rene KocherRegina told her that the guardian would be there to pick the patient up. She states that Guernseyegina left a voice message and told her that the guardian would be there. I informed Ms. Jenean LindauRhodes that the guardian was contacted after the patient was cleared and she was not able to be reached as a means to discharge the patient safely from the ED. Mrs. Jenean LindauRhodes states that this is her work phone and she was not working and did not have the phone. She states that when she gained access to the phone she text the guardian who told her that she would pick the patient up. The Clinical research associatewriter apologized for the misunderstanding and informed her that the staff was unable to communicate with her and called the guardian because the patient was cleared yesterday.  This Clinical research associatewriter asked Ms. Rhodes when she is able to pick the patient up and she states that she does not have coverage for the facility until 8pm and she will pick the patient up after that. Ms. Jenean LindauRhodes states that she told Rene KocherRegina that if the guardian was not there that she would come to pick the patient up.  This Clinical research associatewriter informed Ms. Jenean LindauRhodes that the message would be relayed to the patients nurse that she would be here at [facility]8PM.   Informed patient nurse that patients group home would come to the ED to pick the patient up at 8pm once she has coverage.   Davina PokeJoVea Marylen Zuk, LCSW Therapeutic Triage Specialist [facility] 12/28/2015 6:50 PM
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
## 2015-12-28 NOTE — BH Assessment (Signed)
Spoke with Deputy Stalls [facility] who states that he went out to the group home and spoke with the group home owners husband who states that the group home owner is on an outing with the other group home members and states that the husband states that the group home owner will be back within an hour or two and states that he will make arrangements with his wife once they return from the outing. Deputy Layla BarterStalls states that the husband reported that he was under the impression that the patients guardian would pick her up from the hospital. Informed Deputy that we attempted to call the group home and contacted the guardian due to no one answering from the group home. Deputy Layla BarterStalls states that if no one has come to the hospital to transport the patient or called to arrange transportation to call [employer] back at [phone] and a deputy will go back to the group home to make sure that the group home responds.   Davina PokeJoVea Teleshia Lemere, LCSW Therapeutic Triage Specialist [facility] 12/28/2015 3:05 PM
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
## 2015-12-28 NOTE — ED Notes (Signed)
Breakfast given to pt, however pt is still resting.  NAD.

## 2015-12-28 NOTE — BHH Counselor (Signed)
Contacted [facility] -- [phone] and spoke with Traci to request that someone go out [facility] at [address]  [place], [place] [phone]. Informed her that patient has been cleared since yesterday and no one has been able to reach staff. She states that she will send someone out to see if they can make contact to pick up the patient.    Theresa PokeJoVea Zackary Mckeone, LCSW Therapeutic Triage Specialist [facility] 12/28/2015 2:33 PM

## 2015-12-28 NOTE — BHH Counselor (Signed)
Attempted to contact Deputy Garlick at [phone] and there was an answer with someone reporting that this is not the correct number.   Davina PokeJoVea Senta Kantor, LCSW Therapeutic Triage Specialist [facility] 12/28/2015 7:11 PM

## 2015-12-28 NOTE — [facility] Counselor (Signed)
Spoke with patients sister Theresa [name] at [phone] to inform her that arrangements were made and the group home owner is coming to pick up the patient. Patients sister states that she spoke with the group home owner "five minutes ago" and states "she said if I don't pick her up she's taking me to court." Patients sister states that she called her and told her that the patient needed to go home with her and told her that she needed to come to the ED to pick the patient up by 8AM or should would contact court. This Clinical research associatewriter informed the patients sister that we arranged for the patients group home to pick her up and at that time it was not necessary for her to come at this time as arrangements had been made. She states that she is coming and wants to to be here when the group home staff gets here so that everyone could be on the same page regarding the next steps because she does not want to go to court. This Clinical research associatewriter informed her that the group home staff told [facility] that she would be here at 8 and there may be some miscommunication and she could follow up with her.    Informed Theresa [name] in TTS of this information in report.   Theresa PokeJoVea Taytem Ghattas, LCSW Therapeutic Triage Specialist [facility] 12/28/2015 8:01 PM

## 2015-12-28 NOTE — Clinical Social Work Note (Signed)
CSW called and spoke with pt's sister and legal guardian Trula OreChristina regarding picking pt up from the ED.  Pt's sister reported that she was going to come this evening when her husband gets off work at [employer]7pm.  She also reported that the group home had given a 60 day notice  for pt to discharge on April 15 and has already gotten money from pt for the month of march to reside in her group home.  Pt's sister also stated that the group home director Vennie Homans(barbara Rhodes [phone] ) told her that it was the pt that did not want to come back.  CSW called and left message for group home director with phone numbers to call CSW back.  Elray Buba.Autum Benfer, LCSW [facility] Clinical Social Worker - Weekend Coverage cell #: [phone]

## 2015-12-28 NOTE — Clinical Social Work Note (Signed)
CSW attempted to reach group home director again and had to leave another message.

## 2015-12-28 NOTE — ED Notes (Signed)
Pt very pleasant.  Pt very loving and try to hug other pts.

## 2017-03-06 IMAGING — CR DG CHEST 2V
1 series · 2 of 2 positions shown · non-contrast
Comparison: None

CLINICAL DATA: Chest pain this morning, history asthma

EXAM:
CHEST  2 VIEW

[Series 1: dxr chest pa (or ap) and lateral · 0.14mm/px · 2 of 2 slices shown]
[im 1/2]
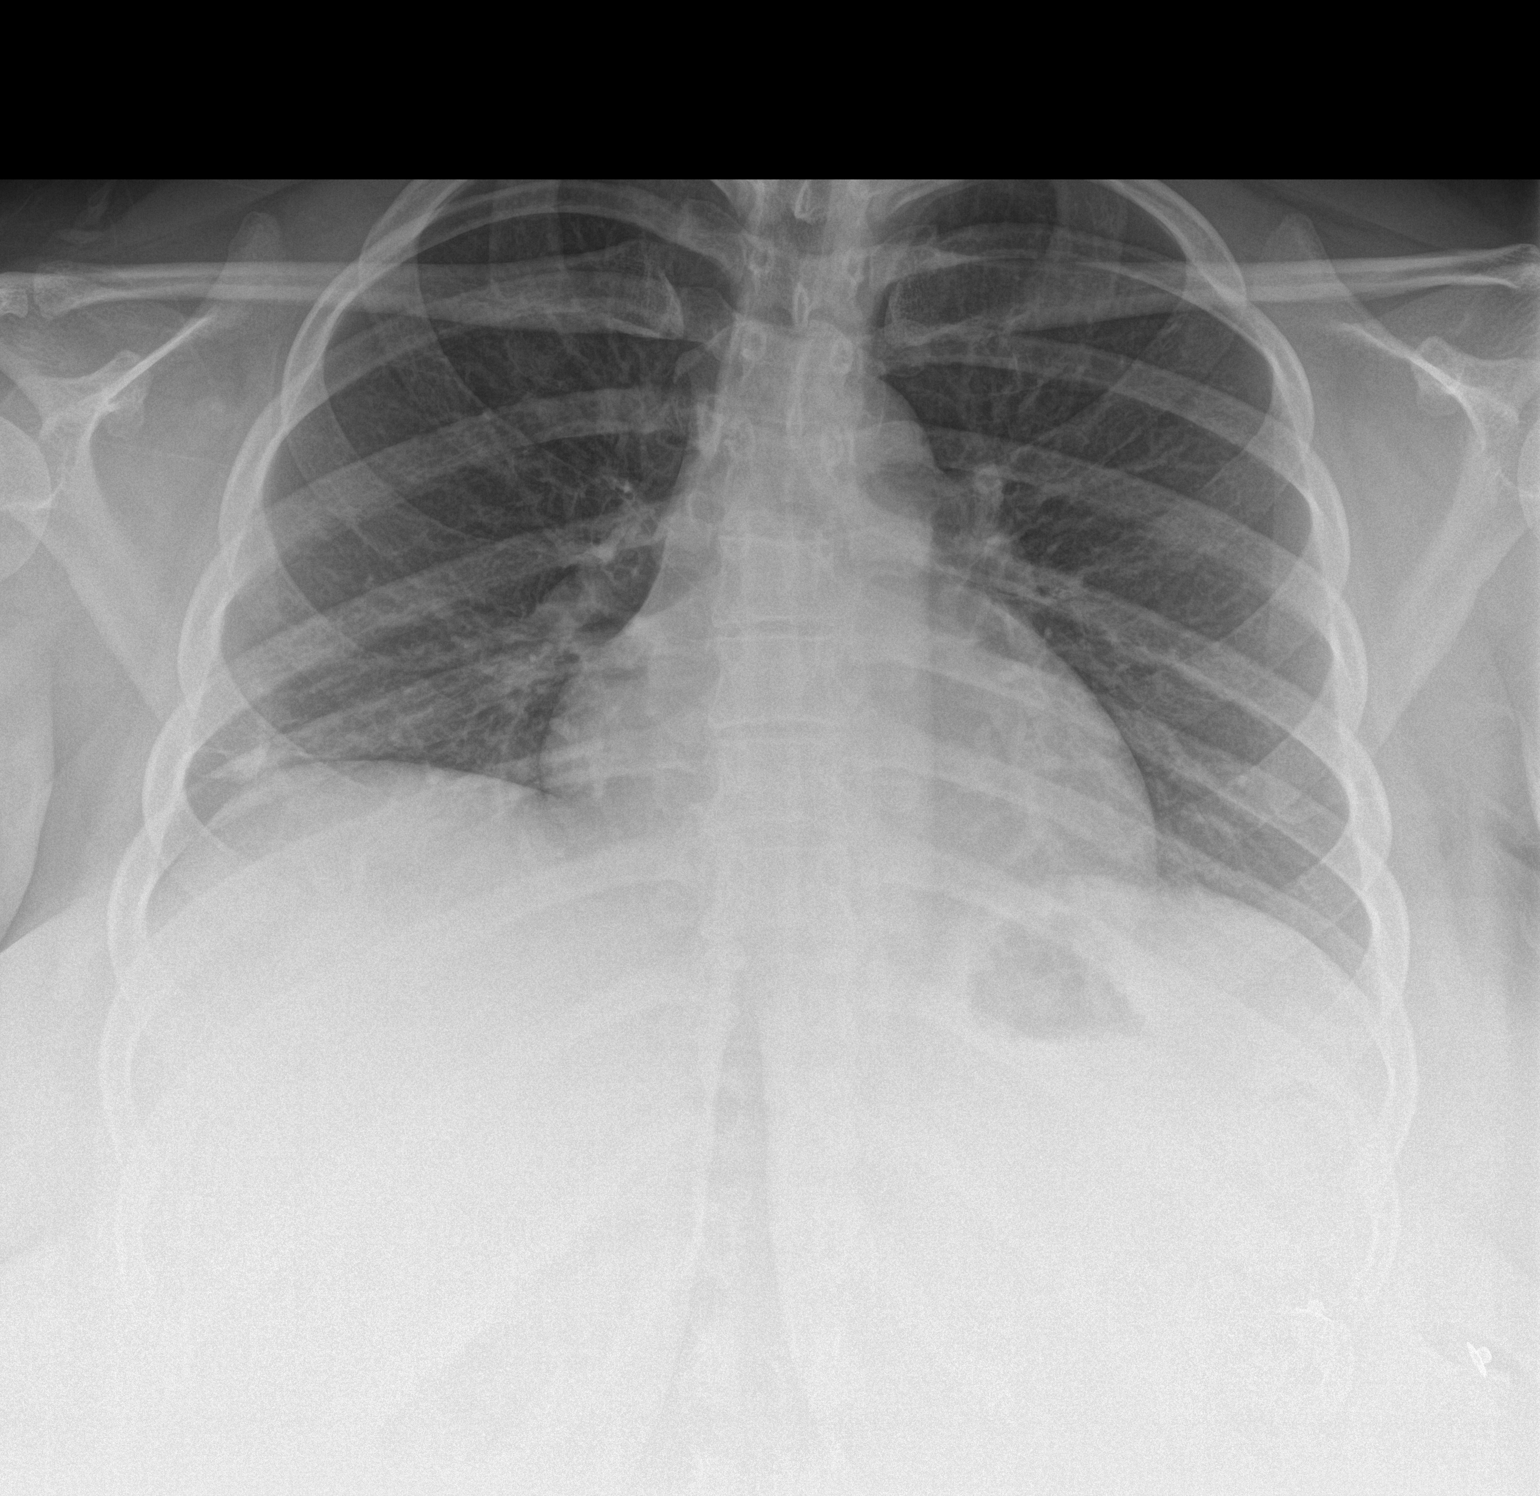
[im 2/2]
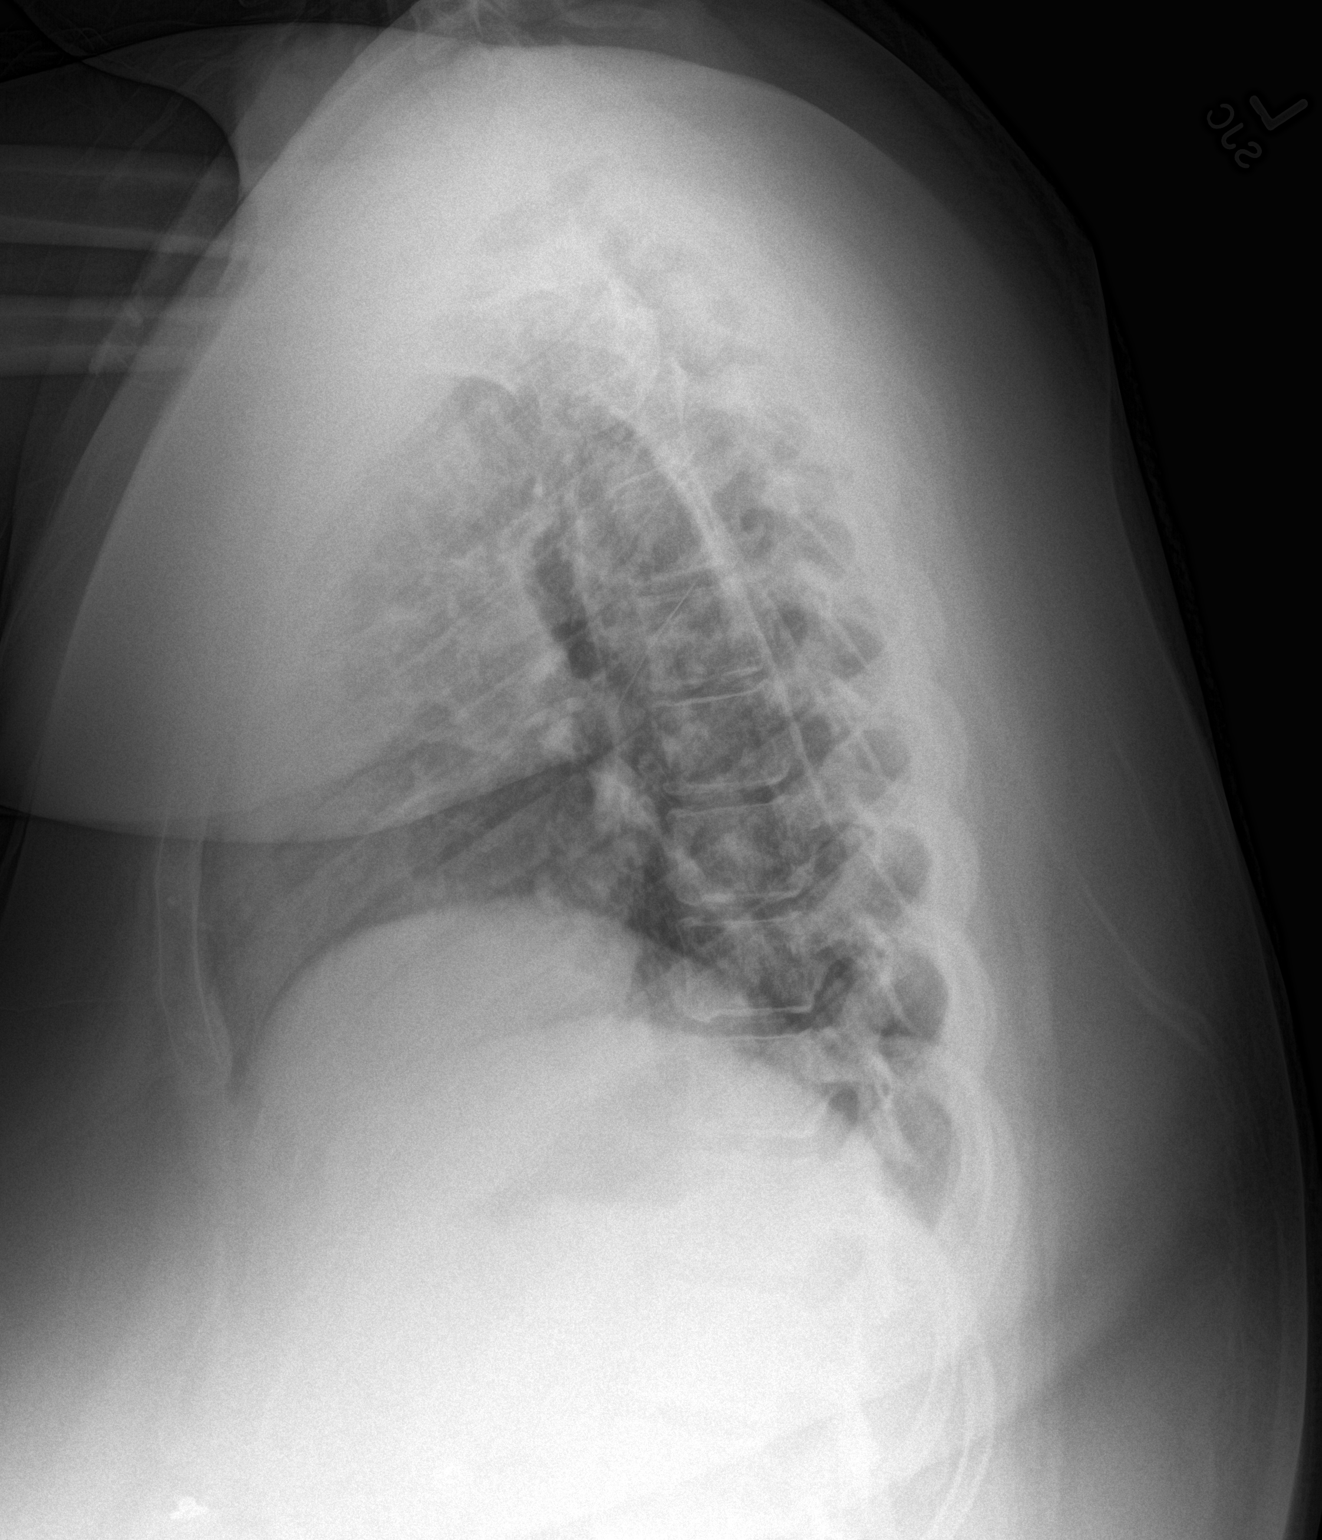

[2 of 2 positions shown; findings below may reference images not displayed]

FINDINGS: Upper normal heart size.

Normal mediastinal contours and pulmonary vascularity.

Basilar hypoinflation with subsegmental atelectasis RIGHT base.

Lungs otherwise clear.

No pleural effusion or pneumothorax.

Osseous structures unremarkable.
IMPRESSION: RIGHT basilar atelectasis.

## 2017-06-10 IMAGING — CR DG CHEST 2V
1 series · 2 of 2 positions shown · non-contrast
Comparison: 02/05/2015

CLINICAL DATA: Shortness of breath, sickle cell anemia

EXAM:
CHEST  2 VIEW

[Series 1: dg chest 2 view · 0.14mm/px · 2 of 2 slices shown]
[im 1/2]
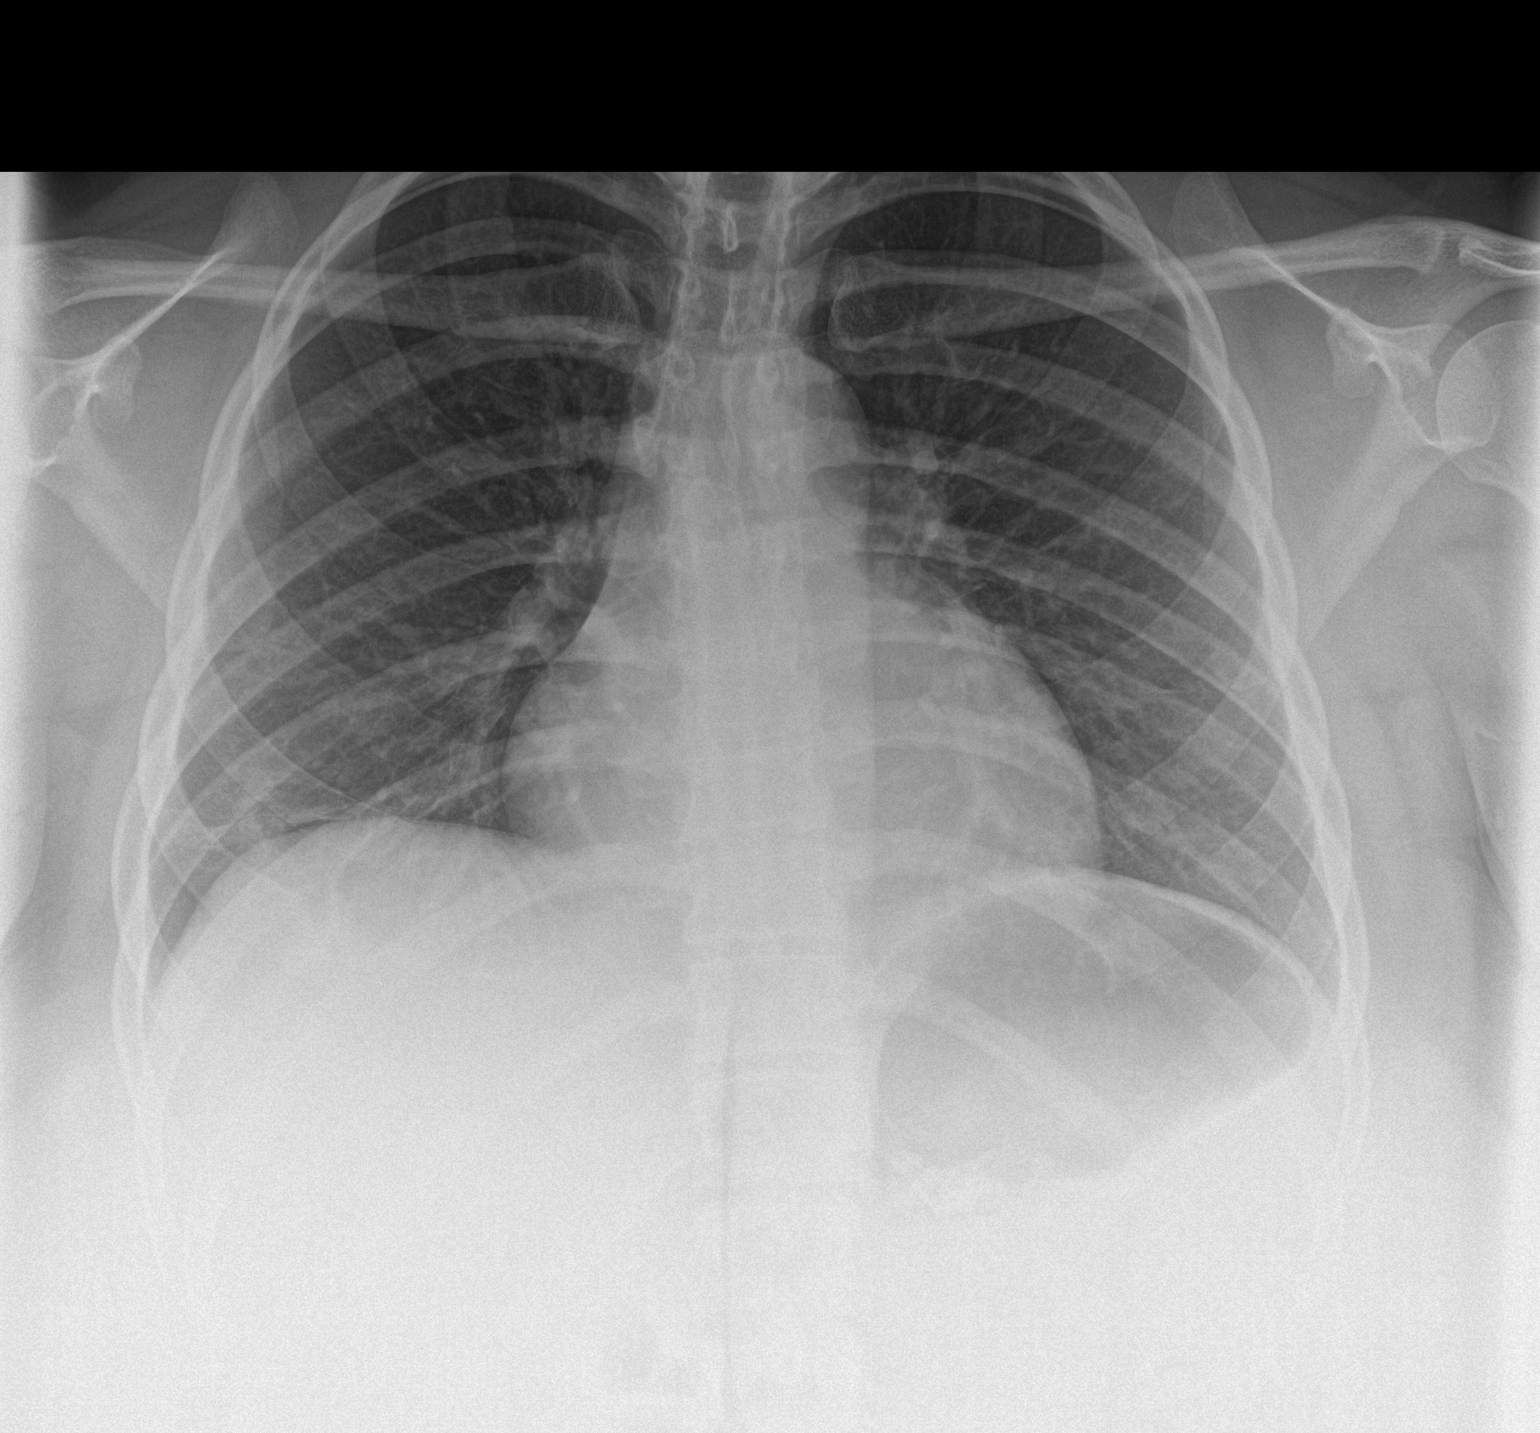
[im 2/2]
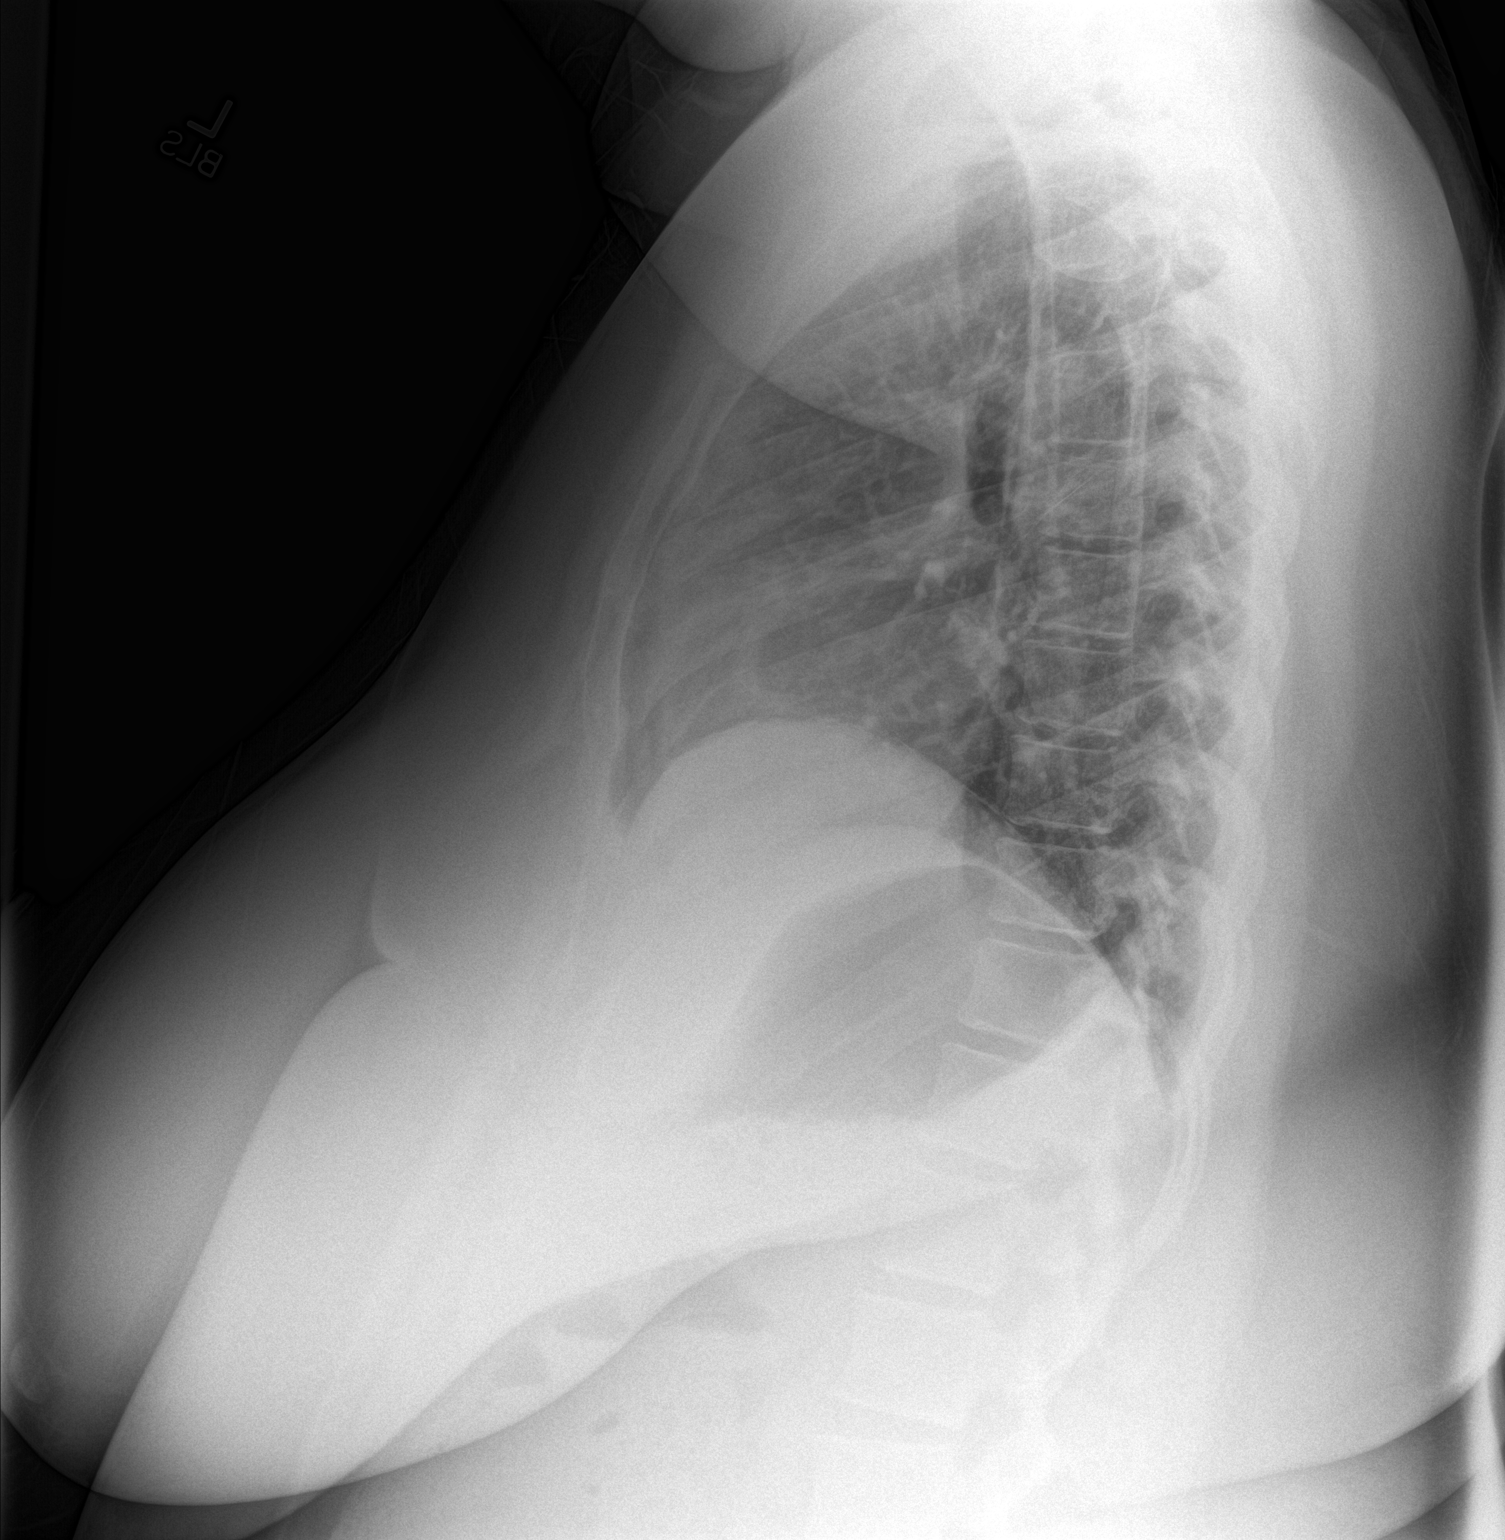

[2 of 2 positions shown; findings below may reference images not displayed]

FINDINGS: The heart size and mediastinal contours are within normal limits.
Both lungs are clear. The visualized skeletal structures are
unremarkable.
IMPRESSION: No active cardiopulmonary disease.
